# Patient Record
Sex: Female | Born: 1935 | Race: White | Hispanic: No | Smoking: Never smoker
Health system: Southern US, Community
[De-identification: ages and names within clinical notes are randomized; demographics above are authoritative.]

## PROBLEM LIST (undated history)

## (undated) DIAGNOSIS — R51 Headache: Secondary | ICD-10-CM

## (undated) DIAGNOSIS — C50911 Malignant neoplasm of unspecified site of right female breast: Secondary | ICD-10-CM

## (undated) DIAGNOSIS — M1990 Unspecified osteoarthritis, unspecified site: Secondary | ICD-10-CM

## (undated) DIAGNOSIS — E785 Hyperlipidemia, unspecified: Secondary | ICD-10-CM

## (undated) DIAGNOSIS — I10 Essential (primary) hypertension: Secondary | ICD-10-CM

## (undated) DIAGNOSIS — N210 Calculus in bladder: Secondary | ICD-10-CM

## (undated) DIAGNOSIS — Z9289 Personal history of other medical treatment: Secondary | ICD-10-CM

## (undated) HISTORY — PX: FOOT SURGERY: SHX648

## (undated) HISTORY — PX: VERTEBROPLASTY: SHX113

## (undated) HISTORY — PX: SPINE SURGERY: SHX786

## (undated) HISTORY — PX: KNEE ARTHROSCOPY: SUR90

## (undated) HISTORY — DX: Essential (primary) hypertension: I10

## (undated) HISTORY — DX: Malignant neoplasm of unspecified site of right female breast: C50.911

## (undated) HISTORY — PX: ABDOMINAL HYSTERECTOMY: SHX81

## (undated) HISTORY — PX: JOINT REPLACEMENT: SHX530

## (undated) HISTORY — PX: TONSILLECTOMY: SUR1361

## (undated) HISTORY — PX: OTHER SURGICAL HISTORY: SHX169

## (undated) HISTORY — PX: APPENDECTOMY: SHX54

## (undated) HISTORY — PX: WRIST FUSION: SHX839

## (undated) HISTORY — PX: DILATION AND CURETTAGE OF UTERUS: SHX78

---

## 1999-09-20 ENCOUNTER — Encounter: Admission: RE | Admit: 1999-09-20 | Discharge: 1999-09-20 | Payer: Self-pay | Admitting: Family Medicine

## 1999-09-20 ENCOUNTER — Encounter: Payer: Self-pay | Admitting: Family Medicine

## 2000-01-25 ENCOUNTER — Encounter: Payer: Self-pay | Admitting: Family Medicine

## 2000-01-25 ENCOUNTER — Encounter: Admission: RE | Admit: 2000-01-25 | Discharge: 2000-01-25 | Payer: Self-pay | Admitting: Family Medicine

## 2001-01-28 ENCOUNTER — Encounter: Payer: Self-pay | Admitting: Family Medicine

## 2001-01-28 ENCOUNTER — Encounter: Admission: RE | Admit: 2001-01-28 | Discharge: 2001-01-28 | Payer: Self-pay | Admitting: Family Medicine

## 2002-01-30 ENCOUNTER — Encounter: Payer: Self-pay | Admitting: Family Medicine

## 2002-01-30 ENCOUNTER — Encounter: Admission: RE | Admit: 2002-01-30 | Discharge: 2002-01-30 | Payer: Self-pay | Admitting: Family Medicine

## 2003-02-01 ENCOUNTER — Encounter: Payer: Self-pay | Admitting: Family Medicine

## 2003-02-01 ENCOUNTER — Encounter: Admission: RE | Admit: 2003-02-01 | Discharge: 2003-02-01 | Payer: Self-pay | Admitting: Family Medicine

## 2004-02-02 ENCOUNTER — Encounter: Admission: RE | Admit: 2004-02-02 | Discharge: 2004-02-02 | Payer: Self-pay | Admitting: Family Medicine

## 2004-10-25 ENCOUNTER — Ambulatory Visit: Payer: Self-pay | Admitting: Gastroenterology

## 2004-11-24 ENCOUNTER — Ambulatory Visit: Payer: Self-pay | Admitting: Gastroenterology

## 2004-12-12 ENCOUNTER — Ambulatory Visit: Payer: Self-pay | Admitting: Gastroenterology

## 2004-12-14 ENCOUNTER — Ambulatory Visit: Payer: Self-pay | Admitting: Gastroenterology

## 2004-12-21 ENCOUNTER — Ambulatory Visit: Payer: Self-pay | Admitting: Gastroenterology

## 2004-12-21 ENCOUNTER — Encounter: Payer: Self-pay | Admitting: Internal Medicine

## 2004-12-28 ENCOUNTER — Ambulatory Visit: Payer: Self-pay | Admitting: Gastroenterology

## 2005-01-29 ENCOUNTER — Ambulatory Visit: Payer: Self-pay | Admitting: Gastroenterology

## 2005-02-02 ENCOUNTER — Encounter: Admission: RE | Admit: 2005-02-02 | Discharge: 2005-02-02 | Payer: Self-pay | Admitting: Family Medicine

## 2005-02-15 ENCOUNTER — Encounter: Admission: RE | Admit: 2005-02-15 | Discharge: 2005-02-15 | Payer: Self-pay | Admitting: Family Medicine

## 2006-02-18 ENCOUNTER — Encounter: Admission: RE | Admit: 2006-02-18 | Discharge: 2006-02-18 | Payer: Self-pay | Admitting: Family Medicine

## 2006-06-04 ENCOUNTER — Inpatient Hospital Stay (HOSPITAL_COMMUNITY): Admission: RE | Admit: 2006-06-04 | Discharge: 2006-06-10 | Payer: Self-pay | Admitting: Orthopedic Surgery

## 2006-06-05 ENCOUNTER — Ambulatory Visit: Payer: Self-pay | Admitting: Physical Medicine & Rehabilitation

## 2007-02-20 ENCOUNTER — Encounter: Admission: RE | Admit: 2007-02-20 | Discharge: 2007-02-20 | Payer: Self-pay | Admitting: Family Medicine

## 2008-02-24 ENCOUNTER — Encounter: Admission: RE | Admit: 2008-02-24 | Discharge: 2008-02-24 | Payer: Self-pay | Admitting: Family Medicine

## 2008-04-20 ENCOUNTER — Encounter: Admission: RE | Admit: 2008-04-20 | Discharge: 2008-04-20 | Payer: Self-pay | Admitting: Orthopedic Surgery

## 2009-02-25 ENCOUNTER — Encounter: Admission: RE | Admit: 2009-02-25 | Discharge: 2009-02-25 | Payer: Self-pay | Admitting: Family Medicine

## 2009-12-26 ENCOUNTER — Encounter (INDEPENDENT_AMBULATORY_CARE_PROVIDER_SITE_OTHER): Payer: Self-pay | Admitting: *Deleted

## 2009-12-28 ENCOUNTER — Encounter (INDEPENDENT_AMBULATORY_CARE_PROVIDER_SITE_OTHER): Payer: Self-pay | Admitting: *Deleted

## 2010-02-01 ENCOUNTER — Encounter (INDEPENDENT_AMBULATORY_CARE_PROVIDER_SITE_OTHER): Payer: Self-pay | Admitting: *Deleted

## 2010-02-01 ENCOUNTER — Ambulatory Visit: Payer: Self-pay | Admitting: Internal Medicine

## 2010-02-01 DIAGNOSIS — Z860100 Personal history of colon polyps, unspecified: Secondary | ICD-10-CM | POA: Insufficient documentation

## 2010-02-01 DIAGNOSIS — K5730 Diverticulosis of large intestine without perforation or abscess without bleeding: Secondary | ICD-10-CM | POA: Insufficient documentation

## 2010-02-21 ENCOUNTER — Ambulatory Visit: Payer: Self-pay | Admitting: Internal Medicine

## 2010-02-26 ENCOUNTER — Encounter: Payer: Self-pay | Admitting: Internal Medicine

## 2010-02-27 ENCOUNTER — Encounter: Admission: RE | Admit: 2010-02-27 | Discharge: 2010-02-27 | Payer: Self-pay | Admitting: Family Medicine

## 2010-12-12 NOTE — Letter (Signed)
Summary: New Patient letter  [facility]  [address] [place], [place] [address]   Phone: [phone]  Fax: [phone]       12/28/2009 MRN: [id number]  [facility] [address] [place], [place]  [address]  Dear Ms. Bellanger,  Welcome to the Gastroenterology Division at [facility].    You are scheduled to see Dr.  Stan Head on February 01, 2010 at 2:45pm on the 3rd floor at [facility], [address]  We ask that you try to arrive at our office 15 minutes prior to your appointment time to allow for check-in.  We would like you to complete the enclosed self-administered evaluation form prior to your visit and bring it with you on the day of your appointment.  We will review it with you.  Also, please bring a complete list of all your medications or, if you prefer, bring the medication bottles and we will list them.  Please bring your insurance card so that we may make a copy of it.  If your insurance requires a referral to see a specialist, please bring your referral form from your primary care physician.  Co-payments are due at the time of your visit and may be paid by cash, check or credit card.     Your office visit will consist of a consult with your physician (includes a physical exam), any laboratory testing he/she may order, scheduling of any necessary diagnostic testing (e.g. x-ray, ultrasound, CT-scan), and scheduling of a procedure (e.g. Endoscopy, Colonoscopy) if required.  Please allow enough time on your schedule to allow for any/all of these possibilities.    If you cannot keep your appointment, please call [phone] to cancel or reschedule prior to your appointment date.  This allows [place] the opportunity to schedule an appointment for another patient in need of care.  If you do not cancel or reschedule by 5 p.m. the business day prior to your appointment date, you will be charged a $50.00 late cancellation/no-show fee.    Thank you  for choosing  Gastroenterology for your medical needs.  We appreciate the opportunity to care for you.  Please visit [place] at our website  to learn more about our practice.                     Sincerely,                                                             The Gastroenterology Division

## 2010-12-12 NOTE — Procedures (Signed)
Summary: Colonoscopy: Dr. Doreatha Martin: Diverticulosis   Colonoscopy  Procedure date:  12/21/2004  Findings:      Results: Polyp. Not retrieved Results: Diverticulosis.       Location:  [facility].    Procedures Next Due Date:    Colonoscopy: 12/2009  Patient Name: Jean [name], Jean [name] MRN:  Procedure Procedures: Colonoscopy CPT: [address].  Personnel: Endoscopist: Ulyess Mort, MD.  Referred By: Janalyn Rouse Steele Berg, MD.  Exam Location: Exam performed in Outpatient Clinic. Outpatient  Patient Consent: Procedure, Alternatives, Risks and Benefits discussed, consent obtained, from patient. Consent was obtained by the RN.  Indications  Evaluation of: Anemia with low iron saturation.  History  Current Medications: Patient is not currently taking Coumadin.  Pre-Exam Physical: Entire physical exam was normal.  Exam Exam: Extent of exam reached: Cecum, extent intended: Cecum.  The cecum was identified by appendiceal orifice and IC valve. Colon retroflexion performed. Images were not taken. ASA Classification: II. Tolerance: good.  Monitoring: Pulse and BP monitoring, Oximetry used. Supplemental O2 given.  Colon Prep Prep results: good.  Sedation Meds: Patient assessed and found to be appropriate for moderate (conscious) sedation. Fentanyl 75 mcg. given IV. Versed 5 mg. given IV.  Findings - DIVERTICULOSIS: Splenic Flexure to Sigmoid Colon. ICD9: Diverticulosis: 562.10. Comments: moderately severe ; area of fixed sigmoid colon.  POLYP: Descending Colon, Maximum size: 3 mm. sessile polyp. Procedure:  hot biopsy, removed, not retrieved,  POLYP: Ascending Colon, Maximum size: 4 mm. sessile polyp. Procedure:  hot biopsy, removed, not retrieved,  POLYP: Descending Colon, Maximum size: 2 mm. sessile polyp. Procedure:  hot biopsy, removed, not retrieved,  - OTHER FINDING: Cecum. Comments: only minnimally visualised because of colon tortuosity.  POLYP: Sigmoid Colon,  Maximum size: 4 mm. sessile polyp. Procedure:  hot biopsy, removed, not retrieved, ICD9: Colon Polyps: 211.3.  POLYP: Sigmoid Colon, Maximum size: 3 mm. sessile polyp. Procedure:  hot biopsy, removed, not retrieved,  POLYP: Sigmoid Colon, Maximum size: 2 mm. sessile polyp. Procedure:  hot biopsy, removed, not retrieved,  POLYP: Sigmoid Colon, Maximum size: 2 mm. sessile polyp. Procedure:  hot biopsy, removed, not retrieved,   Assessment Abnormal examination, see findings above.  Diagnoses: 562.10: Diverticulosis.  211.3: Colon Polyps.   Events  Unplanned Interventions: No intervention was required.  Unplanned Events: There were no complications. Plans Patient Education: Patient given standard instructions for: Polyps. Diverticulosis. Yearly hemoccult testing recommended. Patient instructed to get routine colonoscopy every 5 years. ???  do virtual colonoscopy next time ; pt. very hard to pass endoscope beyond the sigmoid colon  .  Disposition: After procedure patient sent to recovery. After recovery patient sent home.   CC:   Felix Pacini, MD  This report was created from the original endoscopy report, which was reviewed and signed by the above listed endoscopist.

## 2010-12-12 NOTE — Letter (Signed)
Summary: Colonoscopy-Changed to Office Visit Letter  [facility]  [address] [place], [place] [address]   Phone: [phone]  Fax: [phone]      December 26, 2009 MRN: [id number]   [facility] [address] [place], [place]  [address]   Dear Ms. Racanelli,   According to our records, it is time for you to schedule a Colonoscopy. However, after reviewing your medical record, I feel that an office visit would be most appropriate to more completely evaluate you and determine your need for a repeat procedure.  Please call [phone] (option #2) at your convenience to schedule an office visit. If you have any questions, concerns, or feel that this letter is in error, we would appreciate your call.   Sincerely,  Iva Boop, M.D.  [facility] Gastroenterology Division [phone]

## 2010-12-12 NOTE — Letter (Signed)
Summary: [facility] Instructions  [facility]  [address] [place], [place] [address]   Phone: [phone]  Fax: [phone]       JAYLIE NEAVES    1929-03-12    MRN: [id number]      Procedure Day Dorna Bloom: Jake Shark, [birth year]     Arrival Time: 1:00 PM      Procedure Time: 2:00 PM    Location of Procedure:                    _X_   Endoscopy Center (4th Floor)                     PREPARATION FOR COLONOSCOPY WITH MOVIPREP   Starting 5 days prior to your procedure 02/16/10 do not eat nuts, seeds, popcorn, corn, beans, peas,  salads, or any raw vegetables.  Do not take any fiber supplements (e.g. Metamucil, Citrucel, and Benefiber).  THE DAY BEFORE YOUR PROCEDURE         MONDAY, 02/20/10  1.  Drink clear liquids the entire day-NO SOLID FOOD  2.  Do not drink anything colored red or purple.  Avoid juices with pulp.  No orange juice.  3.  Drink at least 64 oz. (8 glasses) of fluid/clear liquids during the day to prevent dehydration and help the prep work efficiently.  CLEAR LIQUIDS INCLUDE: Water Jello Ice Popsicles Tea (sugar ok, no milk/cream) Powdered fruit flavored drinks Coffee (sugar ok, no milk/cream) Gatorade Juice: apple, white grape, white cranberry  Lemonade Clear bullion, consomm, broth Carbonated beverages (any kind) Strained chicken noodle soup Hard Candy                             4.  In the morning, mix first dose of MoviPrep solution:    Empty 1 Pouch A and 1 Pouch B into the disposable container    Add lukewarm drinking water to the top line of the container. Mix to dissolve    Refrigerate (mixed solution should be used within 24 hrs)  5.  Begin drinking the prep at 5:00 p.m. The MoviPrep container is divided by 4 marks.   Every 15 minutes drink the solution down to the next mark (approximately 8 oz) until the full liter is complete.   6.  Follow completed prep with 16 oz of clear liquid of your choice (Nothing red or purple).  Continue  to drink clear liquids until bedtime.  7.  Before going to bed, mix second dose of MoviPrep solution:    Empty 1 Pouch A and 1 Pouch B into the disposable container    Add lukewarm drinking water to the top line of the container. Mix to dissolve    Refrigerate  THE DAY OF YOUR PROCEDURE      TUESDAY, 02/21/10  Beginning at 9:00a.m. (5 hours before procedure):         1. Every 15 minutes, drink the solution down to the next mark (approx 8 oz) until the full liter is complete.  2. Follow completed prep with 16 oz. of clear liquid of your choice.    3. You may drink clear liquids until 12:00 PM (2 HOURS BEFORE PROCEDURE).  MEDICATION INSTRUCTIONS  Unless otherwise instructed, you should take regular prescription medications with a small sip of water   as early as possible the morning of your procedure.       OTHER INSTRUCTIONS  You will  need a responsible adult at least 75 years of age to accompany you and drive you home.   This person must remain in the waiting room during your procedure.  Wear loose fitting clothing that is easily removed.  Leave jewelry and other valuables at home.  However, you may wish to bring a book to read or  an iPod/MP3 player to listen to music as you wait for your procedure to start.  Remove all body piercing jewelry and leave at home.  Total time from sign-in until discharge is approximately 2-3 hours.  You should go home directly after your procedure and rest.  You can resume normal activities the  day after your procedure.  The day of your procedure you should not:   Drive   Make legal decisions   Operate machinery   Drink alcohol   Return to work  You will receive specific instructions about eating, activities and medications before you leave.    The above instructions have been reviewed and explained to me by   _______________________  I fully understand and can verbalize these instructions _____________________________ Date  _________

## 2010-12-12 NOTE — Assessment & Plan Note (Signed)
Summary: consult before proc. per md recall letter,...em   History of Present Illness Visit Type: Initial Visit Primary GI MD: Stan Head MD [facility] Primary Provider: Rosalyn Charters, MD Chief Complaint: history of colon polyps 2006 History of Present Illness:   77 yo white woman with prior colonoscopy 2006 [facility]). She had multiple diminutive polyps destroyed, no pathology. Also had severe diverticulosis and difficult colonoscopy due to somewhat fixed sigmoid colon. Recently passed a kidney stone CT abd/pelvis 01/09/10 reviewed. She is very active and moved a freezer, changed a car battery and inflated generator tires    GI Review of Systems      Denies abdominal pain, acid reflux, belching, bloating, chest pain, dysphagia with liquids, dysphagia with solids, heartburn, loss of appetite, nausea, vomiting, vomiting blood, weight loss, and  weight gain.      Reports hemorrhoids.     Denies anal fissure, black tarry stools, change in bowel habit, constipation, diarrhea, diverticulosis, fecal incontinence, heme positive stool, irritable bowel syndrome, jaundice, light color stool, liver problems, rectal bleeding, and  rectal pain. Preventive Screening-Counseling & Management  Alcohol-Tobacco     Smoking Status: never      Drug Use:  no.      Current Medications (verified): 1)  Exforge 5-320 Mg Tabs (Amlodipine Besylate-Valsartan) .... Once Daily 2)  Nadolol 20 Mg Tabs (Nadolol) .... Once Daily 3)  Diclofenac Sodium 75 Mg Tbec (Diclofenac Sodium) .... Take 1 Tablet Eevery 12 Hours 4)  Furosemide 20 Mg Tabs (Furosemide) .... Once Daily 5)  Pravastatin Sodium 20 Mg Tabs (Pravastatin Sodium) .... Once Daily 6)  Butalbital-Aspirin-Caffeine 50-325-40 Mg Tabs (Butalbital-Aspirin-Caffeine) .... As Needed For Migraines 7)  Cyanocobalamin 1000 Mcg/ml Soln (Cyanocobalamin) .Marland Kitchen.. 1000ug Im Injection Each Month  Allergies (verified): 1)  Pcn 2)  Keflex 3)  Percodan  Past History:  Past  Medical History: Osteoarthritis Degenerative Disk Disease Hx of Ureterolithiasis Asthma Chronic Headaches Hyperlipidemia Hypertension GERD Kidney Stones B12 deficiency  Past Surgical History: Appendectomy Hemorrhoidectomy Hysterectomy Spinal Fusion Knee Replacement-Left Total Foot surgery  Family History: No FH of Colon Cancer: Family History of Diabetes: Mother Family History of Heart Disease: Mother, Father Family History of Kidney Disease:Mother  Social History: Widowed, 2 adopted children Retired [employer] Patient has never smoked.  Alcohol Use - no Illicit Drug Use - no Smoking Status:  never Drug Use:  no  Review of Systems       The patient complains of allergy/sinus, arthritis/joint pain, headaches-new, hearing problems, heart murmur, and urine leakage.         All other ROS negative except as per HPI.   Vital Signs:  Patient profile:   77 year old female Height:      64 inches Weight:      153.13 pounds BMI:     26.38 Pulse rate:   72 / minute Pulse rhythm:   regular BP sitting:   150 / 66  (left arm) Cuff size:   regular  Vitals Entered By: June McMurray CMA [facility]) (February 01, 2010 2:58 PM)  Physical Exam  General:  Well developed, well nourished, no acute distress. Lungs:  Clear throughout to auscultation. Heart:  Regular rate and rhythm; no murmurs, rubs,  or bruits. Abdomen:  soft and non-tender without mass Rectal:  deferred until time of colonoscopy.   Skin:  some traumatic purpura Psych:  Alert and cooperative. Normal mood and affect.   Impression & Recommendations:  Problem # 1:  COLONIC POLYPS, HX OF (ICD-V12.72) Assessment New  Multiple diminutive polyps destroyed 2006. I explained that we do not know if any were adenomatous. Some could have been. So colonoscopy now, based upon abvailable info is reasonable. Her anatomy may make it more difficult and CT colonoscopy offrered but declined. She was also offered iFOBT  but prefers colonoscopy. Risks, benefits,and indications of endoscopic procedure(s) were reviewed with the patient and all questions answered.  Colonoscopy (Colon)  Problem # 2:  SPECIAL SCREENING FOR MALIGNANT NEOPLASMS COLON (ICD-V76.51) Assessment: New  Orders: Colonoscopy (Colon)  Problem # 3:  DIVERTICULOSIS OF COLON (ICD-562.10) Assessment: Comment Only Severe on CT and prior colonoscopy but asymptomatic. Orders: Colonoscopy (Colon)  Patient Instructions: 1)  Please pick up your medications at your pharmacy. MOVIPREP 2)  We will see you at your procedure on 02/21/10. 3)  [facility] Patient Information Guide given to patient.  4)  Colonoscopy and Flexible Sigmoidoscopy brochure given.  5)  The medication list was reviewed and reconciled.  All changed / newly prescribed medications were explained.  A complete medication list was provided to the patient / caregiver. Prescriptions: MOVIPREP 100 GM  SOLR (PEG-KCL-NACL-NASULF-NA ASC-C) As per prep instructions.  #1 x 0   Entered by:   Francee Piccolo CMA (AAMA)   Authorized by:   Iva Boop MD, [facility]   Signed by:   Francee Piccolo CMA (AAMA) on 02/01/2010   Method used:   Electronically to        [facility]* (retail)       [address]       [place], [place]  [address]       Ph: [phone]       Fax: [phone]   RxID:   [id number]

## 2010-12-12 NOTE — Procedures (Signed)
Summary: EGD: Dr. Doreatha Martin:    EGD  Procedure date:  12/21/2004  Findings:      Findings: Gastritis  RUT Neg Location: [facility]   Patient Name: Jean, [name] MRN:  Procedure Procedures: Panendoscopy (EGD) CPT: 43235.  Personnel: Endoscopist: Ulyess Mort, MD.  Referred By: Janalyn Rouse Steele Berg, MD.  Exam Location: Exam performed in Outpatient Clinic. Outpatient  Patient Consent: Procedure, Alternatives, Risks and Benefits discussed, consent obtained, from patient. Consent was obtained by the RN.  Indications  Evaluation of: Anemia,  with low ferritin. with low iron saturation.  History  Current Medications: Patient is not currently taking Coumadin.  Pre-Exam Physical: Entire physical exam was normal.  Exam Exam Info: Maximum depth of insertion Duodenum, intended Duodenum. Patient position: on left side. Vocal cords visualized. Gastric retroflexion performed. Images were not taken. ASA Classification: II. Tolerance: good.  Sedation Meds: Patient assessed and found to be appropriate for moderate (conscious) sedation. Fentanyl 50 mcg. given IV. Versed 7 mg. given IV. Cetacaine Spray 2 sprays given aerosolized.  Monitoring: BP and pulse monitoring done. Oximetry used. Supplemental O2 given  Findings - HIATAL HERNIA: 1 cms. in length. ICD9: Hernia, Hiatal: 553.3. - MUCOSAL ABNORMALITY: Cardia to Antrum. Granular mucosa. Edema present. RUT done, results pending. ICD9: Gastritis, Chronic: 535.10.  - Normal: Pyloric Sphincter to Jejunum.   Assessment Abnormal examination, see findings above.  Diagnoses: 553.3: Hernia, Hiatal.  535.10: Gastritis, Chronic.   Events  Unplanned Intervention: No unplanned interventions were required.  Unplanned Events: There were no complications. Plans Medication(s): Await pathology. Continue current medications. PPI: Esomeprazole/Nexium   Patient Education: Patient given standard instructions for: Hiatal  Hernia. Mucosal Abnormality.  Disposition: After procedure patient sent to recovery. After recovery patient sent home.   CC:   Felix Pacini, MD  This report was created from the original endoscopy report, which was reviewed and signed by the above listed endoscopist.

## 2010-12-12 NOTE — Procedures (Signed)
Summary: Colonoscopy  Patient: Jean [name] Note: All result statuses are Final unless otherwise noted.  Tests: (1) Colonoscopy (COL)   COL Colonoscopy           DONE     [facility]     [address]     [place], [place]  [address]           COLONOSCOPY PROCEDURE REPORT           PATIENT:  [name], [name]  MR#:  [id number]     BIRTHDATE:  [birth year], 80 yrs. old  GENDER:  female     ENDOSCOPIST:  Iva Boop, MD, [facility]           PROCEDURE DATE:  02/21/2010     PROCEDURE:  Colonoscopy with snare polypectomy     ASA CLASS:  Class II     INDICATIONS:  surveillance and high-risk screening, history of     polyps multiple polyps destroyed 2006     MEDICATIONS:   Fentanyl 75 mcg IV, Versed 8 mg IV           DESCRIPTION OF PROCEDURE:   After the risks benefits and     alternatives of the procedure were thoroughly explained, informed     consent was obtained.  Digital rectal exam was performed and     revealed no abnormalities.   The LB [id number]-[id number] endoscope     was introduced through the anus and advanced to the cecum, which     was identified by both the appendix and ileocecal valve, limited     by diverticulosis, severe, a tortuous colon.    The quality of the     prep was good, using MoviPrep.  The instrument was then slowly     withdrawn as the colon was fully examined.     Insertion: 12:55 minutes Withdrawal: 7:35 minutes     <<PROCEDUREIMAGES>>           FINDINGS:  A diminutive polyp was found in the ascending colon.     Polyp was snared without cautery. Retrieval was successful. snare     polyp  Severe diverticulosis was found throughout the colon.     Especially severe in sigmoid with angulation and luminal stenosis.     This was otherwise a normal examination of the colon.     Retroflexed views in the rectum revealed internal hemorrhoids.     The scope was then withdrawn from the patient and the procedure     completed.           COMPLICATIONS:  None  ENDOSCOPIC IMPRESSION:     1) Diminutive polyp in the ascending colon - removed     2) Severe diverticulosis throughout the colon, especially in the     sigmoid     3) Internal hemorrhoids     4) Otherwise normal examination, good prep     RECOMMENDATIONS:     1) Await pathology results           REPEAT EXAM:  at her age and with the difficulty of inserting     colonoscope not inclined to recommend routine repeat colonoscopy           Iva Boop, MD, [facility]           CC:  The Patient     Felix Pacini, MD           n.     eSIGNED:  Iva Boop at 02/21/2010 02:59 PM           Adria Devon, [id number]  Note: An exclamation mark (!) indicates a result that was not dispersed into the flowsheet. Document Creation Date: 02/21/2010 2:59 PM _______________________________________________________________________  (1) Order result status: Final Collection or observation date-time: 02/21/2010 14:45 Requested date-time:  Receipt date-time:  Reported date-time:  Referring Physician:   Ordering Physician: Stan Head [phone]) Specimen Source:  Source: Launa Grill Order Number: [id number] Lab site:

## 2010-12-12 NOTE — Letter (Signed)
Summary: Patient Notice-Hyperplastic Polyps  [facility]  [address] [place], [place] [address]   Phone: [phone]  Fax: [phone]        February 26, 2010 MRN: [id number]    [facility] [id number] [place], [place]  [address]    Dear Ms. Sobocinski,  I am pleased to inform you that the colon polyp removed during your recent colonoscopy was NOT pre-cancerous.  It is therefore my recommendation that you do not have a routine repeat colonoscopy examination given these findings and your overall history.  Should you develop new or worsening symptoms of abdominal pain, bowel habit changes or bleeding from the rectum or bowels, please schedule an evaluation with either your primary care physician or with me.  Please call us if you are having persistent problems or have questions about your condition that have not been fully answered at this time.   Sincerely,  Iva Boop MD, [facility] This letter has been electronically signed by your physician.  Appended Document: Patient Notice-Hyperplastic Polyps letter mailed 4.18.11  Appended Document: Patient Notice-Hyperplastic Polyps February 26, 2010 MRN: [id number]       [facility] [address] [place], [place]  [address]     Dear Ms. Lindor,  I am pleased to inform you that the colon polyp removed during your recent colonoscopy was NOT pre-cancerous.  It is therefore my recommendation that you do not have a routine repeat colonoscopy examination given these findings and your overall history.  Should you develop new or worsening symptoms of abdominal pain, bowel habit changes or bleeding from the rectum or bowels, please schedule an evaluation with either your primary care physician or with me.  Please call us if you are having persistent problems or have questions about your condition that have not been fully answered at this time.   Sincerely,  Iva Boop MD, [facility] This letter has been  electronically signed by your physician.

## 2010-12-28 ENCOUNTER — Ambulatory Visit: Payer: Self-pay | Admitting: Physical Therapy

## 2011-03-22 ENCOUNTER — Other Ambulatory Visit: Payer: Self-pay | Admitting: Family Medicine

## 2011-03-22 DIAGNOSIS — Z1231 Encounter for screening mammogram for malignant neoplasm of breast: Secondary | ICD-10-CM

## 2011-03-30 NOTE — Discharge Summary (Signed)
NAMEJOLIET, MALLOZZI                 ACCOUNT NO.:  [id number]   MEDICAL RECORD NO.:  [id number]          PATIENT TYPE:  INP   LOCATION:  5005                         FACILITY:  [facility]   PHYSICIAN:  Burnard Bunting, M.D.    DATE OF BIRTH:  January 08, 1929   DATE OF ADMISSION:  06/04/2006  DATE OF DISCHARGE:  06/10/2006                                 DISCHARGE SUMMARY   DISCHARGE DIAGNOSES:  Left knee arthritis.   SECONDARY DIAGNOSES:  1. History of spinal fusion.  2. Hypertension.  3. Gastroesophageal reflux disease.  4. Hypercholesterolemia.   OPERATIONS/PROCEDURES:  Left total knee replacement,  06/04/2006.   HOSPITAL COURSE:  This is a 77 year old patient who presents today with left  knee arthritis.  She underwent left total knee replacement 06/04/2006.  She  tolerated the procedure well without immediate complications.  She had  intact perfusion and sensation to the foot.  On postop day #1 she was  started on Coumadin for DVT prophylaxis, physical therapy for mobilization  and TPM for range of motion.  She mobilized well in the hospital.  The  incision was intact on postop day #3.  She was therapeutic on her Coumadin  by the time of discharge.  Hemoglobin was 10 on postop day #2.  She had an  otherwise unremarkable recovery and was ambulating in the hall by the time  of discharge.  She was discharged home on 06/10/2006 in good condition.   DISCHARGE MEDICATIONS:  Include:  1. Cozaar.  2. Nadolol.  3. Omeprazole.  4. Lipitor.  5. Triamterene  6. Hydrochlorothiazide.  7. Percocet for pain.  8. Robaxin muscle relaxant.  9. Coumadin for DVT prophylaxis.   FOLLOW UP:  She is to follow up with me in 1 week for suture removal,  continue weight bearing as tolerated.  Home LPT and CPM machine.           ______________________________  G. Dorene Grebe, M.D.     GSD/MEDQ  D:  07/03/2006  T:  07/04/2006  Job:  [id number]

## 2011-03-30 NOTE — Op Note (Signed)
NAMEONA, ROEHRS NO.:  [id number]   MEDICAL RECORD NO.:  [id number]          PATIENT TYPE:  INP   LOCATION:  5005                         FACILITY:  [facility]   PHYSICIAN:  Burnard Bunting, M.D.    DATE OF BIRTH:  January 06, 1929   DATE OF PROCEDURE:  06/04/2006  DATE OF DISCHARGE:                                 OPERATIVE REPORT   PREOPERATIVE DIAGNOSIS:  Left knee arthritis.   POSTOPERATIVE DIAGNOSIS:  Same.   PROCEDURE:  Left total knee replacement.   SURGEON:  Burnard Bunting, M.D.   ASSISTANT:  Jerolyn Shin. Lavender, M.D.   ESTIMATED BLOOD LOSS:  150 mL.   DRAINS:  Hemovac times one.   TOURNIQUET TIME:  Two hours at 300 mmHg followed by 15 minutes of down time  followed by 12 minutes up at 300 mmHg for cementation.   COMPONENTS:  DePuy's posterior stabilizer, #3 femur, #2.5 tibia, #10 Pollack  and #32 patella.   INDICATION:  Jean [name] is a 77 year old female with end stage left knee  arthritis who has failed conservative management.  She presents now for a  knee replacement.  All risks and benefits discussed with the patient. She  wished to procedure.   PROCEDURE IN DETAIL:  The patient was brought to the operating where general  endotracheal anesthesia was induced.  Preoperative antibiotic was  administered.  The left leg was prepped with DuraPrep solution and draped in  a sterile manner.  Vi Drape was used to cover the operative field.  The leg  was elevated and exsanguinated with Esmarch tourniquet which was inflated.  Anterior posterior knee was entered and the subcutaneous tissue was sharply  divided.  A median peripatellar arthrotomy was made with precise location of  this marked with the suture.  The acetabulum was partially excised.  The ACL  and PCL were released.  Lateral patellofemoral ligament was released.  Soft  tissues from the anterior distal aspect of the femur were resected with  minimal violation of the suprapatellar pouch.  At this time  two pins were  placed in the distal medial femur and proximal medial tibia.  Registration  points were obtained beginning with the center of the hip rotation as well  as the medial lateral malleoli and then various points about the knee.  Tibial cut was made in the course of preoperative tem plating and to  generate a model.  Collateral and posterior ligaments near basilar  structures were protected.  Tension was then placed in the full extension  and 90 degrees of flexion.  Distal femoral cut was then made.  Chain four  and five cuts were made.  Trials were placed.  Soft tissue was released from  the fissure aspect and the menisci were resected.  The incision was then  marked with chalk components and then the patient achieved full extension at  2 1/2 to 3 degrees of valgus with excellent patella tracking.  At this time  the patella was cut free hand with 10 mm resection off the 20 mm patella.  32 mm button  was placed and the trial parameters were maintained.  The  patient had excellent stability of varus and valgus stress at 0, 30 and 90  degrees of flexion.  The patient's knee was thoroughly irrigated.  Chalk  components were removed.  Tourniquet was released.  Continuous irrigation  was performed.  Tourniquet was then re-inflated 15 minutes after its release  and the components were cemented into position.  Excess cement was removed.  The trunk was released.  Bleeding points encountered were controlled with  electrocautery.  The incision was then closed over a drain using a #1 Vicryl  suture which was followed with interrupted  2-0 Vicryl suture and skin  stapes.  The incisions for the pins were then also closed using 3-0 Nylon  suture.  The patient was placed in a bulky dressing knee immobilizer.  She  was injected with a solution of Marcaine and clonidine.  The assistance of  Dr. Tresa Res was required for retraction of neurovascular structures.  Good  visualization of the drain and  cementation.           ______________________________  G. Dorene Grebe, M.D.     GSD/MEDQ  D:  06/04/2006  T:  06/05/2006  Job:  [id number]

## 2011-03-30 NOTE — Op Note (Signed)
NAMETAJANA, CROTTEAU NO.:  [id number]   MEDICAL RECORD NO.:  [id number]          PATIENT TYPE:  INP   LOCATION:  5005                         FACILITY:  [facility]   PHYSICIAN:  Burnard Bunting, M.D.    DATE OF BIRTH:  [birth year]   DATE OF PROCEDURE:  06/04/2006  DATE OF DISCHARGE:  06/10/2006                                 OPERATIVE REPORT   SURGEON:  Burnard Bunting, M.D.   ASSISTANT:  Jerolyn Shin. Tresa Res, M.D.   PREOPERATIVE DIAGNOSIS:  Left knee arthritis.   POSTOPERATIVE DIAGNOSIS:  Left knee arthritis.   PROCEDURE:  Left total knee replacement using cemented posterior cruciate  ligament-sacrificing DePuy components, rotating platform, 3 femur, 2.5  tibia, 10 polyethylene, 32 patella.   ANESTHESIA:  General.   ESTIMATED BLOOD LOSS:  150.   DRAINS:  Hemovac.   Dictation ended at this point.           ______________________________  G. Dorene Grebe, M.D.     GSD/MEDQ  D:  07/03/2006  T:  07/03/2006  Job:  [id number]

## 2011-04-02 ENCOUNTER — Ambulatory Visit
Admission: RE | Admit: 2011-04-02 | Discharge: 2011-04-02 | Disposition: A | Discharge status: Home / Self Care | Payer: Medicare Other | Source: Ambulatory Visit | Attending: Family Medicine | Admitting: Family Medicine

## 2011-04-02 DIAGNOSIS — Z1231 Encounter for screening mammogram for malignant neoplasm of breast: Secondary | ICD-10-CM

## 2012-03-07 ENCOUNTER — Other Ambulatory Visit: Payer: Self-pay | Admitting: Family Medicine

## 2012-03-07 DIAGNOSIS — Z1231 Encounter for screening mammogram for malignant neoplasm of breast: Secondary | ICD-10-CM

## 2012-04-04 ENCOUNTER — Ambulatory Visit
Admission: RE | Admit: 2012-04-04 | Discharge: 2012-04-04 | Disposition: A | Discharge status: Home / Self Care | Payer: Medicare Other | Source: Ambulatory Visit | Attending: Family Medicine | Admitting: Family Medicine

## 2012-04-04 DIAGNOSIS — Z1231 Encounter for screening mammogram for malignant neoplasm of breast: Secondary | ICD-10-CM

## 2012-04-14 ENCOUNTER — Other Ambulatory Visit: Payer: Self-pay | Admitting: Family Medicine

## 2012-04-14 DIAGNOSIS — R928 Other abnormal and inconclusive findings on diagnostic imaging of breast: Secondary | ICD-10-CM

## 2012-04-22 ENCOUNTER — Ambulatory Visit
Admission: RE | Admit: 2012-04-22 | Discharge: 2012-04-22 | Disposition: A | Discharge status: Home / Self Care | Payer: Medicare Other | Source: Ambulatory Visit | Attending: Family Medicine | Admitting: Family Medicine

## 2012-04-22 ENCOUNTER — Other Ambulatory Visit: Payer: Self-pay | Admitting: Family Medicine

## 2012-04-22 DIAGNOSIS — R928 Other abnormal and inconclusive findings on diagnostic imaging of breast: Secondary | ICD-10-CM

## 2012-04-23 ENCOUNTER — Other Ambulatory Visit: Payer: Self-pay | Admitting: Family Medicine

## 2012-04-23 DIAGNOSIS — C50911 Malignant neoplasm of unspecified site of right female breast: Secondary | ICD-10-CM

## 2012-04-24 ENCOUNTER — Telehealth: Payer: Self-pay | Admitting: *Deleted

## 2012-04-24 ENCOUNTER — Other Ambulatory Visit: Payer: Self-pay | Admitting: *Deleted

## 2012-04-24 DIAGNOSIS — C50419 Malignant neoplasm of upper-outer quadrant of unspecified female breast: Secondary | ICD-10-CM

## 2012-04-24 NOTE — Telephone Encounter (Signed)
Confirmed [facility] for 04/30/12 at 0800.  Instructions and contact information given.  

## 2012-04-25 ENCOUNTER — Ambulatory Visit
Admission: RE | Admit: 2012-04-25 | Discharge: 2012-04-25 | Disposition: A | Discharge status: Home / Self Care | Payer: Medicare Other | Source: Ambulatory Visit | Attending: Family Medicine | Admitting: Family Medicine

## 2012-04-25 DIAGNOSIS — C50911 Malignant neoplasm of unspecified site of right female breast: Secondary | ICD-10-CM

## 2012-04-25 MED ORDER — GADOBENATE DIMEGLUMINE 529 MG/ML IV SOLN
15.0000 mL | Freq: Once | INTRAVENOUS | Status: AC | PRN
  Administered 2012-04-25: 15 mL via INTRAVENOUS

## 2012-04-28 ENCOUNTER — Other Ambulatory Visit: Payer: Medicare Other

## 2012-04-29 ENCOUNTER — Other Ambulatory Visit: Payer: Self-pay | Admitting: Family Medicine

## 2012-04-29 DIAGNOSIS — R928 Other abnormal and inconclusive findings on diagnostic imaging of breast: Secondary | ICD-10-CM

## 2012-04-30 ENCOUNTER — Encounter: Payer: Self-pay | Admitting: *Deleted

## 2012-04-30 ENCOUNTER — Other Ambulatory Visit (HOSPITAL_BASED_OUTPATIENT_CLINIC_OR_DEPARTMENT_OTHER): Payer: Medicare Other | Admitting: Lab

## 2012-04-30 ENCOUNTER — Ambulatory Visit
Admission: RE | Admit: 2012-04-30 | Discharge: 2012-04-30 | Disposition: A | Discharge status: Home / Self Care | Payer: Medicare Other | Source: Ambulatory Visit | Attending: Radiation Oncology | Admitting: Radiation Oncology

## 2012-04-30 ENCOUNTER — Ambulatory Visit: Payer: Medicare Other

## 2012-04-30 ENCOUNTER — Ambulatory Visit: Payer: Medicare Other | Admitting: Physical Therapy

## 2012-04-30 ENCOUNTER — Ambulatory Visit (HOSPITAL_BASED_OUTPATIENT_CLINIC_OR_DEPARTMENT_OTHER): Payer: Medicare Other | Admitting: Surgery

## 2012-04-30 ENCOUNTER — Encounter: Payer: Self-pay | Admitting: Oncology

## 2012-04-30 ENCOUNTER — Ambulatory Visit (HOSPITAL_BASED_OUTPATIENT_CLINIC_OR_DEPARTMENT_OTHER): Payer: Medicare Other | Admitting: Oncology

## 2012-04-30 ENCOUNTER — Telehealth: Payer: Self-pay | Admitting: *Deleted

## 2012-04-30 ENCOUNTER — Encounter: Payer: Self-pay | Admitting: Radiation Oncology

## 2012-04-30 ENCOUNTER — Encounter (INDEPENDENT_AMBULATORY_CARE_PROVIDER_SITE_OTHER): Payer: Self-pay | Admitting: Surgery

## 2012-04-30 VITALS — BP 120/70 | HR 72 | Temp 98.0°F | Resp 16

## 2012-04-30 VITALS — BP 210/85 | HR 60 | Temp 98.2°F | Ht 61.0 in | Wt 149.4 lb

## 2012-04-30 DIAGNOSIS — M129 Arthropathy, unspecified: Secondary | ICD-10-CM

## 2012-04-30 DIAGNOSIS — C50419 Malignant neoplasm of upper-outer quadrant of unspecified female breast: Secondary | ICD-10-CM

## 2012-04-30 DIAGNOSIS — I10 Essential (primary) hypertension: Secondary | ICD-10-CM

## 2012-04-30 DIAGNOSIS — C50919 Malignant neoplasm of unspecified site of unspecified female breast: Secondary | ICD-10-CM

## 2012-04-30 DIAGNOSIS — C50911 Malignant neoplasm of unspecified site of right female breast: Secondary | ICD-10-CM

## 2012-04-30 DIAGNOSIS — Z170 Estrogen receptor positive status [ER+]: Secondary | ICD-10-CM

## 2012-04-30 DIAGNOSIS — C50511 Malignant neoplasm of lower-outer quadrant of right female breast: Secondary | ICD-10-CM | POA: Insufficient documentation

## 2012-04-30 HISTORY — DX: Malignant neoplasm of unspecified site of right female breast: C50.911

## 2012-04-30 LAB — COMPREHENSIVE METABOLIC PANEL
ALT: 14 U/L (ref 0–35)
AST: 22 U/L (ref 0–37)
Alkaline Phosphatase: 71 U/L (ref 39–117)
CO2: 25 mEq/L (ref 19–32)
Sodium: 136 mEq/L (ref 135–145)
Total Bilirubin: 0.3 mg/dL (ref 0.3–1.2)
Total Protein: 6.9 g/dL (ref 6.0–8.3)

## 2012-04-30 LAB — CBC WITH DIFFERENTIAL/PLATELET
BASO%: 0.7 % (ref 0.0–2.0)
EOS%: 0.9 % (ref 0.0–7.0)
LYMPH%: 10 % — ABNORMAL LOW (ref 14.0–49.7)
MCH: 29.9 pg (ref 25.1–34.0)
MCHC: 33.4 g/dL (ref 31.5–36.0)
MONO#: 0.8 10*3/uL (ref 0.1–0.9)
Platelets: 273 10*3/uL (ref 145–400)
RBC: 4.12 10*6/uL (ref 3.70–5.45)
WBC: 13.6 10*3/uL — ABNORMAL HIGH (ref 3.9–10.3)

## 2012-04-30 LAB — CANCER ANTIGEN 27.29: CA 27.29: 15 U/mL (ref 0–39)

## 2012-04-30 NOTE — Patient Instructions (Addendum)
I will see you back after your surgery

## 2012-04-30 NOTE — Progress Notes (Signed)
Patient ID: Jean Jean [name], female   DOB: [birth year], 78 y.o.   MRN: [id number]  Chief Complaint  Patient presents with  . Other    right breast cancer    HPI Jean Jean [name] is Jean [name] 78 y.o. female.  Pleasant female referred by Dr. Felix Pacini in for evaluation of Jean [name] recent diagnosis of right breast cancer found on screening mammography. She has had no previous problems with her breast. She denies nipple discharge. She has no other complaints. HPI  Past Medical History  Diagnosis Date  . Hypertension     Past Surgical History  Procedure Date  . Abdominal hysterectomy   . Appendectomy   . Joint replacement   . Spine surgery   . Hernia repair     History reviewed. No pertinent family history.  Social History History  Substance Use Topics  . Smoking status: Not on file  . Smokeless tobacco: Not on file  . Alcohol Use:     Allergies  Allergen Reactions  . Cephalexin   . Oxycodone-Aspirin   . Penicillins     Current Outpatient Prescriptions  Medication Sig Dispense Refill  . amLODipine-valsartan (EXFORGE) 10-320 MG per tablet Take 1 tablet by mouth daily.      . ferrous sulfate 325 (65 FE) MG tablet Take 325 mg by mouth daily with breakfast.      . hydrALAZINE (APRESOLINE) 25 MG tablet Take 25 mg by mouth 3 (three) times daily.      . meloxicam (MOBIC) 15 MG tablet Take 15 mg by mouth daily.      . nadolol (CORGARD) 40 MG tablet Take 40 mg by mouth daily.      . potassium chloride (K-DUR) 10 MEQ tablet Take 20 mEq by mouth daily.      . pravastatin (PRAVACHOL) 40 MG tablet Take 40 mg by mouth daily.        Review of Systems Review of Systems  Constitutional: Negative for fever, chills and unexpected weight change.  HENT: Negative for hearing loss, congestion, sore throat, trouble swallowing and voice change.   Eyes: Negative for visual disturbance.  Respiratory: Negative for cough and wheezing.   Cardiovascular: Negative for chest pain, palpitations and leg swelling.   Gastrointestinal: Negative for nausea, vomiting, abdominal pain, diarrhea, constipation, blood in stool, abdominal distention and anal bleeding.  Genitourinary: Negative for hematuria, vaginal bleeding and difficulty urinating.  Musculoskeletal: Negative for arthralgias.  Skin: Negative for rash and wound.  Neurological: Negative for seizures, syncope and headaches.  Hematological: Negative for adenopathy. Does not bruise/bleed easily.  Psychiatric/Behavioral: Negative for confusion.    Blood pressure 120/70, pulse 72, temperature 98 F (36.7 C), resp. rate 16.  Physical Exam Physical Exam  Constitutional: She is oriented to person, place, and time. She appears well-developed and well-nourished. No distress.  HENT:  Head: Normocephalic and atraumatic.  Right Ear: External ear normal.  Left Ear: External ear normal.  Nose: Nose normal.  Mouth/Throat: Oropharynx is clear and moist. No oropharyngeal exudate.  Eyes: Conjunctivae are normal. Pupils are equal, round, and reactive to light. Right eye exhibits no discharge. Left eye exhibits no discharge. No scleral icterus.  Neck: Normal range of motion. Neck supple. No tracheal deviation present. No thyromegaly present.  Cardiovascular: Normal rate, regular rhythm, normal heart sounds and intact distal pulses.   No murmur heard. Pulmonary/Chest: Effort normal and breath sounds normal. No respiratory distress. She has no wheezes. She has no rales.  Abdominal: Soft. Bowel sounds are normal.  She exhibits no distension. There is no tenderness. There is no rebound.  Musculoskeletal: Normal range of motion. She exhibits no edema and no tenderness.  Lymphadenopathy:    She has no cervical adenopathy.  Neurological: She is alert and oriented to person, place, and time.  Skin: Skin is warm and dry. No rash noted. She is not diaphoretic. No erythema.  Psychiatric: Her behavior is normal. Thought content normal.    Data Reviewed I have reviewed  her mammograms, MRI, and biopsy results showing ER/PR positive lobular cancer of the right breast. There are 2 other suspicious areas in the right breast on MRI  Assessment    Invasive lobular right breast cancer 2 other suspicious areas.    Plan    The patient already came here to the breast clinic with Jean [name] decision that she wanted to go ahead and proceed with Jean [name] right simple mastectomy rather than biopsied these other areas. We have discussed this and feel this is reasonable. I discussed double mastectomy with her in detail. I discussed the risks of surgery which includes but not limited to bleeding, infection, seroma formation, injury to surround structures, need for further surgery, et Karie Soda. We are not planning sentinel lymph node biopsy at this time. I also discussed reconstruction with her and she has declined. Right simple mastectomy will thus be scheduled. Likelihood of success is good.       Jean Jean [name] 04/30/2012, 9:51 AM

## 2012-04-30 NOTE — Progress Notes (Signed)
Radiation Oncology         [phone] ________________________________  Initial outpatient Consultation  Name: Jean [name] MRN: [id number]  Date: 04/30/2012  DOB: [birth year]  MV:HQIONG,EXBM, MD  Shelly Rubenstein, MD   REFERRING PHYSICIAN: Shelly Rubenstein, MD  DIAGNOSIS: The encounter diagnosis was Breast cancer, right.  HISTORY OF PRESENT ILLNESS::Jean [name] is a 78 y.o. female who is seen as part of the multidisciplinary breast clinic. A recent screening mammogram showed a possible mass within the right breast. Patient proceeded to undergo additional imaging and biopsy as documented below. Biopsy revealed invasive lobular tumor which was estrogen positive at 100% and progesterone receptor positive at 100%. There was no HER-2/neu amplification. Patient proceeded to undergo the MRI of the breast/chest area which revealed the dominant mass in the upper-outer quadrant of the right breast which measured 2.0 x 1.8 x 2.0 cm. In addition within the lateral aspect there was noted to have additional 1 cm area of linear enhancement as well as a 5 mm enhancing nodule in the central portion of the right breast. Patient is now seen for a further evaluation. Her x-rays and pathology were also reviewed earlier today at the multidisciplinary  breast conference.   PREVIOUS RADIATION THERAPY: No  PAST MEDICAL HISTORY:  has a past medical history of Hypertension and Breast cancer, right (04/30/2012).    PAST SURGICAL HISTORY: Past Surgical History  Procedure Date  . Abdominal hysterectomy   . Appendectomy   . Joint replacement   . Spine surgery   . Hernia repair     FAMILY HISTORY: No family history of breast or ovarian cancer.  SOCIAL HISTORY:  reports that she has never smoked. She does not have any smokeless tobacco history on file. no history of ethanol use. 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 amLODipine-valsartan (EXFORGE) 10-320 MG per tablet Take 1 tablet by mouth daily.      . ferrous sulfate 325 (65 FE) MG tablet Take 325 mg by mouth daily with breakfast.      . hydrALAZINE (APRESOLINE) 25 MG tablet Take 25 mg by mouth 3 (three) times daily.      . meloxicam (MOBIC) 15 MG tablet Take 15 mg by mouth daily.      . nadolol (CORGARD) 40 MG tablet Take 40 mg by mouth daily.      . potassium chloride (K-DUR) 10 MEQ tablet Take 20 mEq by mouth daily.      . pravastatin (PRAVACHOL) 40 MG tablet Take 40 mg by mouth daily.        REVIEW OF SYSTEMS:  A 15 point review of systems is documented in the electronic medical record. This was obtained by the nursing staff. However, I reviewed this with the patient to discuss relevant findings and make appropriate changes. Prior to biopsy the patient denied any pain in the breast area nipple discharge or bleeding. She has never had a prior biopsy of the breast.   PHYSICAL EXAM: In general this is a very pleasant well-kept healthy-appearing 78 year old female no acute distress. She is accompanied by her daughter on evaluation today. Vital signs temperature 98.2 pulse  she's  pulse 72 respirations 20 blood pressure is 120/70. No palpable cervical supraclavicular or axillary adenopathy. The lungs are clear to auscultation. The heart has regular rhythm and rate. The abdomen is soft and nontender with normal bowel sounds. Examination  left breast reveals no mass or nipple discharge. Examination right breast reveals some bruising in the upper outer aspect of the breast area. There is no dominant mass appreciated breast nipple discharge or bleeding.   LABORATORY DATA:  Lab Results  Component Value Date   WBC 13.6* 04/30/2012   HGB 12.3 04/30/2012   HCT 36.9 04/30/2012   MCV 89.5 04/30/2012   PLT 273 04/30/2012   Lab Results  Component Value Date   NA 136 04/30/2012   K 3.6 04/30/2012   CL 101 04/30/2012    CO2 25 04/30/2012   Lab Results  Component Value Date   ALT 14 04/30/2012   AST 22 04/30/2012   ALKPHOS 71 04/30/2012   BILITOT 0.3 04/30/2012     RADIOGRAPHY: US Breast Right  04/22/2012  *RADIOLOGY REPORT*  Clinical Data:  The patient returns for evaluation of a possible mass in the right upper outer quadrant posteriorly noted on recent screening study dated 04/04/2012.  DIGITAL DIAGNOSTIC RIGHT LIMITED MAMMOGRAM  AND RIGHT BREAST ULTRASOUND:  Comparison:  04/02/2011, 02/27/2010, 02/25/2009  Findings:  Additional views demonstrate an asymmetric density with central fat in the right upper outer quadrant posteriorly.  On physical exam, no mass is palpated in the right upper outer quadrant.  Ultrasound is performed, showing an ill-defined hypoechoic mass at 10 o'clock 7 cm in the right nipple measuring 1.1 x 0.6 x 0.9 cm. Sonography of the right axilla demonstrates no abnormal nodes. This may represent stromal fibrosis but invasive mammary carcinoma is not excluded.  Ultrasound-guided core needle biopsy is recommended.  IMPRESSION: Ill-defined hypoechoic mass at 10 o'clock 7 cm from the right nipple.  Ultrasound-guided core needle biopsy is suggested.  This will be performed and reported separately.  BI-RADS CATEGORY 4:  Suspicious abnormality - biopsy should be considered.  Original Report Authenticated By: Daryl Eastern, M.D.   Mr Breast Bilateral W Wo Contrast  04/28/2012  *RADIOLOGY REPORT*  Clinical Data: Recently diagnosed right breast invasive lobular carcinoma  BUN and creatinine were obtained on site at [facility] at [address] Results:  BUN 12 mg/dL,  Creatinine 1.1 mg/dL.  BILATERAL BREAST MRI WITH AND WITHOUT CONTRAST  Technique: Multiplanar, multisequence MR images of both breasts were obtained prior to and following the intravenous administration of 15ml of multihance.  Three dimensional images were evaluated at the independent DynaCad workstation.  Comparison:   Mammograms dated 04/22/2012 and 04/04/2012  Findings: There is a moderate background parenchymal enhancement pattern.  Right breast: 1.  In the posterior one third of the upper outer quadrant of the right breast there is an irregular, enhancing mass measuring 2.0 x 1.8 x 2.0 cm.  It is associated with a clip artifact. 2.  In the lateral aspect of the right breast there is a 1 cm area of linear enhancement. 3.  There is a 5 mm enhancing nodule in the central portion of the right breast. 4.  There is a 4 mm enhancing nodule in the upper inner quadrant of the right breast.  No abnormal enhancement is seen in the left  breast.  There is no enlarged axillary or internal mammary adenopathy.  IMPRESSION:  1.  2 cm enhancing mass in the upper outer quadrant of the right breast corresponding well with the known malignancy. 2. In addition two enhancing nodules and one area of linear enhancement are seen in the right breast.  I would recommend MR guided core biopsies of the central nodule and the linear  area of enhancement. 3.  No abnormal enhancement in the left breast.  RECOMMENDATION: BI-RADS CATEGORY 4:  Suspicious abnormality - biopsy should be considered.  THREE-DIMENSIONAL MR IMAGE RENDERING ON INDEPENDENT WORKSTATION:  Three-dimensional MR images were rendered by post-processing of the original MR data on an independent workstation.  The three- dimensional MR images were interpreted, and findings were reported in the accompanying complete MRI report for this study.  Original Report Authenticated By: Littie Deeds. Judyann Munson, M.D.   [place] Core Biopsy  04/23/2012  *RADIOLOGY REPORT*  Clinical Data:  Ill-defined hypoechoic mass at 10 o'clock 7 cm from the right nipple.  ULTRASOUND GUIDED VACUUM ASSISTED CORE BIOPSY OF THE RIGHT BREAST  The patient and I discussed the procedure of ultrasound-guided biopsy, including benefits and alternatives.  We discussed the high likelihood of a successful procedure. We discussed the risks of the  procedure including infection, bleeding, tissue injury, clip migration, and inadequate sampling.  Informed written consent was given.  Using sterile technique, 2% lidocaine ultrasound guidance and a 12 gauge vacuum assisted needle, biopsy was performed of the mass at 10 o'clock 7 cm from the right nipple.  At the conclusion of the procedure, a coil tissue marker clip was deployed into the biopsy cavity.  Follow-up 2-view mammogram was performed and dictated separately.  Histologic evaluation demonstrates grade I - II invasive mammary carcinoma.  E-cadherin stains to determine if this is ductal or lobular carcinoma are pending. This is concordant with the imaging findings.  Results were discussed with the patient by telephone at her request.  She reports no complications from the procedure.  The patient requests consultation with the [facility].  This has been scheduled for 04/30/2012. Preoperative breast MRI will be attempted on 04/28/2012.  The patient does have some claustrophobia so this may not be possible  IMPRESSION: Ultrasound-guided biopsy of a mass at 10 o'clock 7 cm from the right nipple.  Grade I-II invasive mammary carcinoma is diagnosed. Breast MRI will be attempted on 04/28/2012.  The patient has been scheduled to be seen in the [facility] on 04/30/2012.  No apparent complications.  Original Report Authenticated By: Daryl Eastern, M.D.   Mm Digital Diag Ltd R  04/22/2012  *RADIOLOGY REPORT*  Clinical Data:  The patient returns for evaluation of a possible mass in the right upper outer quadrant posteriorly noted on recent screening study dated 04/04/2012.  DIGITAL DIAGNOSTIC RIGHT LIMITED MAMMOGRAM  AND RIGHT BREAST ULTRASOUND:  Comparison:  04/02/2011, 02/27/2010, 02/25/2009  Findings:  Additional views demonstrate an asymmetric density with central fat in the right upper outer quadrant posteriorly.  On physical exam, no mass  is palpated in the right upper outer quadrant.  Ultrasound is performed, showing an ill-defined hypoechoic mass at 10 o'clock 7 cm in the right nipple measuring 1.1 x 0.6 x 0.9 cm. Sonography of the right axilla demonstrates no abnormal nodes. This may represent stromal fibrosis but invasive mammary carcinoma is not excluded.  Ultrasound-guided core needle biopsy is recommended.  IMPRESSION: Ill-defined hypoechoic mass at 10 o'clock 7 cm from the right nipple.  Ultrasound-guided core needle biopsy is suggested.  This will be performed and reported separately.  BI-RADS CATEGORY 4:  Suspicious abnormality - biopsy should be considered.  Original Report Authenticated By: Daryl Eastern, M.D.   Mm Digital Diagnostic Unilat R  04/22/2012  *RADIOLOGY REPORT*  Clinical Data:  Ultrasound-guided core needle biopsy of a hypoechoic area at 10 o'clock 7 cm from  the right nipple with clip placement.  DIGITAL DIAGNOSTIC RIGHT MAMMOGRAM  Comparison:  None.  Findings:  Films are performed following ultrasound guided biopsy of a hypoechoic area at 10 o'clock 7 cm from the right nipple.  The coil clip is appropriately positioned.  IMPRESSION: Appropriate clip placement following ultrasound-guided core needle biopsy of a hypoechoic area at 10 o'clock 7 cm from the right nipple.  Original Report Authenticated By: Daryl Eastern, M.D.   Mm Digital Screening  04/04/2012  *RADIOLOGY REPORT*  Clinical Data: Screening.  MAMMOGRAPHIC BILATERAL DIGITAL SCREENING WITH CAD  Findings: Two views of each breast demonstrate scattered fibroglandular.  In the right breast, a possible mass warrants further evaluation with spot compression views and possibly ultrasound.  In the left breast, no masses or malignant type calcifications are identified.  Compared with priors.  Images were processed with CAD.  IMPRESSION: Further evaluation is suggested for possible mass in the right breast.  RECOMMENDATION: Diagnostic mammogram and possibly  ultrasound of the right breast. (Code:FI-R-68M)  BI-RADS CATEGORY 0:  Incomplete.  Need additional imaging evaluation and/or prior mammograms for comparison.  Original Report Authenticated By: Darrol Angel, M.D.      IMPRESSION: Invasive lobular carcinoma of the right breast. I discussed with Jean [name] that her MRI shows 2 other suspicious areas within the right breast. This would require biopsy prior to considering breast conserving therapy. Given this issue the patient would feel more comfortable with proceeding with simple mastectomy. She is not interested in breast conserving therapy at this time. Patient has met with Dr. Magnus Ivan who is in agreement. Patient will also be meeting with Dr. Welton Flakes later today for medical oncology evaluation.  Since patient is proceeding with a mastectomy I anticipate that she will not require radiation therapy as part of her overall management.  PLAN: Simple mastectomy likely followed by adjuvant hormonal therapy.     -----------------------------------------------  Billie Lade, PhD, MD

## 2012-04-30 NOTE — Progress Notes (Signed)
Jean [name] [id number] 1929/07/03 77 y.o. 04/30/2012 12:46 PM  CC  Carolynn Serve, MD No address on file Dr. Abigail Miyamoto Dr. Antony Blackbird  REASON FOR CONSULTATION:  78 year old female with new diagnosis of invasive lobular carcinoma of the right breast. Patient is status post needle core biopsy of the 10:00 mass 7 cm from the nipple performed on 04/22/2012. Tumor was ER +100% PR +100% proliferation marker Ki-67 31% HER-2/neu negative. Patient was seen in the Multidisciplinary Breast Clinic for discussion of her treatment options.   STAGE:   Breast cancer, right   Primary site: Breast   Staging method: AJCC 7th Edition   Clinical: (T1c, N0, cM0)   Summary: (T1c, N0, cM0)  REFERRING PHYSICIAN: Dr. Abigail Miyamoto  HISTORY OF PRESENT ILLNESS:  Jean [name] is a 78 y.o. female With medical history significant for arthritis and hypertension and history of nephrolithiasis. Patient was seen for an annual screening mammogram at the breast Center on 04/04/2012. The mammogram showed a possible mass in the right breast. She went on to have spot compression views performed on 04/22/2012. The mammogram continue to show asymmetric density with central fact in the right upper outer quadrant posteriorly. She had an ultrasound that showed a ill-defined hypoechoic mass at the 10:00 position 7 cm from the right nipple measuring 1.1 x 0.6 x 0.9 cm. Sonography of the right axilla showed no abnormal nodes. Patient went on to have a needle core biopsy performed on 04/22/2012. The pathology revealed a invasive mammary carcinoma he could hear and immunohistochemical stain performed was negative confirming a lobular phenotype. The tumor was estrogen receptor +100% strongly staining progesterone receptor +100% strongly staining proliferation marker Ki-67 31% HER-2/neu was negative with a ratio 1.12. Patient is now seen in the multidisciplinary breast clinic for discussion of treatment options. On 04/25/2012  patient did have MRI of the breasts performed in the right breast in the posterior one third of the upper outer quadrant there was noted to be an irregular enhancing mass measuring 2.0 x 1.8 x 2.0 cm. In the lateral aspect of the right breast there was a stent 1 cm area of linear enhancement and these third area measuring 5 mm enhancing nodule in the central portion of the right breast. There was also noted a 4 mm enhancing nodule in the upper inner quadrant of the right breast. No abnormal enhancement was seen in the left breast no enlarged axillary or internal mammary adenopathy. Patient is without any complaints.   Past Medical History: Past Medical History  Diagnosis Date  . Hypertension   . Breast cancer, right 04/30/2012    Invasive lobular    Past Surgical History: Past Surgical History  Procedure Date  . Abdominal hysterectomy   . Appendectomy   . Joint replacement   . Spine surgery   . Hernia repair     Family History: No family history on file.  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 History  Substance Use Topics  . Smoking status: Never Smoker   . Smokeless tobacco: Not on file  . Alcohol Use: Not on file    Allergies: Allergies  Allergen Reactions  . Cephalexin   . Oxycodone-Aspirin   . Penicillins     Current Medications: Current Outpatient Prescriptions  Medication Sig Dispense Refill  . amLODipine-valsartan (EXFORGE) 10-320 MG per tablet Take 1 tablet by mouth daily.      . ferrous sulfate  325 (65 FE) MG tablet Take 325 mg by mouth daily with breakfast.      . hydrALAZINE (APRESOLINE) 25 MG tablet Take 25 mg by mouth 3 (three) times daily.      . meloxicam (MOBIC) 15 MG tablet Take 15 mg by mouth daily.      . nadolol (CORGARD) 40 MG tablet Take 40 mg by mouth daily.      . potassium chloride (K-DUR) 10 MEQ tablet Take 20 mEq by mouth daily.      . pravastatin (PRAVACHOL) 40  MG tablet Take 40 mg by mouth daily.        OB/GYN History:Menarche at age 47 patient is postmenopausal she has never been on hormone replacement therapy. Patient is nulliparas Her children are adopted.  Fertility Discussion: N/A Prior History of Cancer:N/A  Health Maintenance:  Colonoscopy 2 years ago Bone Density Patient has had a bone density but she cannot recall when the last one was. Last PAP smear Unknown  ECOG PERFORMANCE STATUS: 1 - Symptomatic but completely ambulatory  Genetic Counseling/testing: Patient's family history is reviewed and at this time genetic counseling and testing is not recommended.  REVIEW OF SYSTEMS:  Constitutional: positive for fatigue Ears, nose, mouth, throat, and face: positive for nasal congestion Respiratory: positive for dyspnea on exertion Cardiovascular: negative Gastrointestinal: negative Integument/breast: positive for breast tenderness Hematologic/lymphatic: positive for easy bruising Musculoskeletal:positive for arthralgias and stiff joints Neurological: negative  PHYSICAL EXAMINATION: Blood pressure 210/85, pulse 60, temperature 98.2 F (36.8 C), height 5\' 1"  (1.549 m), weight 149 lb 6.4 oz (67.767 kg).  AVW:UJWJX, healthy, no distress, well nourished and well developed SKIN: skin color, texture, turgor are normal HEAD: Normocephalic EYES: PERRLA, EOMI, Conjunctiva are pink and non-injected, sclera clear EARS: External ears normal OROPHARYNX:no exudate and no erythema  NECK: supple, no adenopathy, thyroid normal size, non-tender, without nodularity LYMPH:  no palpable lymphadenopathy, no hepatosplenomegaly BREAST:left breast normal without mass, skin or nipple changes or axillary nodes, abnormal mass palpable Which likely is a hematoma on the right side from the biopsy otherwise no other changes LUNGS: clear to auscultation and percussion HEART: regular rate & rhythm and no murmurs ABDOMEN:abdomen soft, non-tender, obese,  normal bowel sounds and no masses or organomegaly BACK: Back symmetric, no curvature. EXTREMITIES:no edema, no clubbing, no cyanosis  NEURO: alert & oriented x 3 with fluent speech, no focal motor/sensory deficits, gait normal     STUDIES/RESULTS: US Breast Right  04/22/2012  *RADIOLOGY REPORT*  Clinical Data:  The patient returns for evaluation of a possible mass in the right upper outer quadrant posteriorly noted on recent screening study dated 04/04/2012.  DIGITAL DIAGNOSTIC RIGHT LIMITED MAMMOGRAM  AND RIGHT BREAST ULTRASOUND:  Comparison:  04/02/2011, 02/27/2010, 02/25/2009  Findings:  Additional views demonstrate an asymmetric density with central fat in the right upper outer quadrant posteriorly.  On physical exam, no mass is palpated in the right upper outer quadrant.  Ultrasound is performed, showing an ill-defined hypoechoic mass at 10 o'clock 7 cm in the right nipple measuring 1.1 x 0.6 x 0.9 cm. Sonography of the right axilla demonstrates no abnormal nodes. This may represent stromal fibrosis but invasive mammary carcinoma is not excluded.  Ultrasound-guided core needle biopsy is recommended.  IMPRESSION: Ill-defined hypoechoic mass at 10 o'clock 7 cm from the right nipple.  Ultrasound-guided core needle biopsy is suggested.  This will be performed and reported separately.  BI-RADS CATEGORY 4:  Suspicious abnormality - biopsy should be considered.  Original Report Authenticated  By: Daryl Eastern, M.[name].   Mr Breast Bilateral W Wo Contrast  04/28/2012  *RADIOLOGY REPORT*  Clinical Data: Recently diagnosed right breast invasive lobular carcinoma  BUN and creatinine were obtained on site at [facility] at [address] Results:  BUN 12 mg/dL,  Creatinine 1.1 mg/dL.  BILATERAL BREAST MRI WITH AND WITHOUT CONTRAST  Technique: Multiplanar, multisequence MR images of both breasts were obtained prior to and following the intravenous administration of 15ml of multihance.  Three  dimensional images were evaluated at the independent DynaCad workstation.  Comparison:  Mammograms dated 04/22/2012 and 04/04/2012  Findings: There is a moderate background parenchymal enhancement pattern.  Right breast: 1.  In the posterior one third of the upper outer quadrant of the right breast there is an irregular, enhancing mass measuring 2.0 x 1.8 x 2.0 cm.  It is associated with a clip artifact. 2.  In the lateral aspect of the right breast there is a 1 cm area of linear enhancement. 3.  There is a 5 mm enhancing nodule in the central portion of the right breast. 4.  There is a 4 mm enhancing nodule in the upper inner quadrant of the right breast.  No abnormal enhancement is seen in the left  breast.  There is no enlarged axillary or internal mammary adenopathy.  IMPRESSION:  1.  2 cm enhancing mass in the upper outer quadrant of the right breast corresponding well with the known malignancy. 2. In addition two enhancing nodules and one area of linear enhancement are seen in the right breast.  I would recommend MR guided core biopsies of the central nodule and the linear area of enhancement. 3.  No abnormal enhancement in the left breast.  RECOMMENDATION: BI-RADS CATEGORY 4:  Suspicious abnormality - biopsy should be considered.  THREE-DIMENSIONAL MR IMAGE RENDERING ON INDEPENDENT WORKSTATION:  Three-dimensional MR images were rendered by post-processing of the original MR data on an independent workstation.  The three- dimensional MR images were interpreted, and findings were reported in the accompanying complete MRI report for this study.  Original Report Authenticated By: Littie Deeds. Judyann Munson, M.[name].   [place] Core Biopsy  04/23/2012  *RADIOLOGY REPORT*  Clinical Data:  Ill-defined hypoechoic mass at 10 o'clock 7 cm from the right nipple.  ULTRASOUND GUIDED VACUUM ASSISTED CORE BIOPSY OF THE RIGHT BREAST  The patient and I discussed the procedure of ultrasound-guided biopsy, including benefits and alternatives.  We  discussed the high likelihood of a successful procedure. We discussed the risks of the procedure including infection, bleeding, tissue injury, clip migration, and inadequate sampling.  Informed written consent was given.  Using sterile technique, 2% lidocaine ultrasound guidance and a 12 gauge vacuum assisted needle, biopsy was performed of the mass at 10 o'clock 7 cm from the right nipple.  At the conclusion of the procedure, a coil tissue marker clip was deployed into the biopsy cavity.  Follow-up 2-view mammogram was performed and dictated separately.  Histologic evaluation demonstrates grade I - II invasive mammary carcinoma.  E-cadherin stains to determine if this is ductal or lobular carcinoma are pending. This is concordant with the imaging findings.  Results were discussed with the patient by telephone at her request.  She reports no complications from the procedure.  The patient requests consultation with the [facility].  This has been scheduled for 04/30/2012. Preoperative breast MRI will be attempted on 04/28/2012.  The patient does have some claustrophobia so this may not be possible  IMPRESSION: Ultrasound-guided biopsy of a mass at 10 o'clock 7 cm from the right nipple.  Grade I-II invasive mammary carcinoma is diagnosed. Breast MRI will be attempted on 04/28/2012.  The patient has been scheduled to be seen in the [facility] on 04/30/2012.  No apparent complications.  Original Report Authenticated By: Daryl Eastern, M.[name].   Mm Digital Diag Ltd R  04/22/2012  *RADIOLOGY REPORT*  Clinical Data:  The patient returns for evaluation of a possible mass in the right upper outer quadrant posteriorly noted on recent screening study dated 04/04/2012.  DIGITAL DIAGNOSTIC RIGHT LIMITED MAMMOGRAM  AND RIGHT BREAST ULTRASOUND:  Comparison:  04/02/2011, 02/27/2010, 02/25/2009  Findings:  Additional views demonstrate an asymmetric density  with central fat in the right upper outer quadrant posteriorly.  On physical exam, no mass is palpated in the right upper outer quadrant.  Ultrasound is performed, showing an ill-defined hypoechoic mass at 10 o'clock 7 cm in the right nipple measuring 1.1 x 0.6 x 0.9 cm. Sonography of the right axilla demonstrates no abnormal nodes. This may represent stromal fibrosis but invasive mammary carcinoma is not excluded.  Ultrasound-guided core needle biopsy is recommended.  IMPRESSION: Ill-defined hypoechoic mass at 10 o'clock 7 cm from the right nipple.  Ultrasound-guided core needle biopsy is suggested.  This will be performed and reported separately.  BI-RADS CATEGORY 4:  Suspicious abnormality - biopsy should be considered.  Original Report Authenticated By: Daryl Eastern, M.[name].   Mm Digital Diagnostic Unilat R  04/22/2012  *RADIOLOGY REPORT*  Clinical Data:  Ultrasound-guided core needle biopsy of a hypoechoic area at 10 o'clock 7 cm from the right nipple with clip placement.  DIGITAL DIAGNOSTIC RIGHT MAMMOGRAM  Comparison:  None.  Findings:  Films are performed following ultrasound guided biopsy of a hypoechoic area at 10 o'clock 7 cm from the right nipple.  The coil clip is appropriately positioned.  IMPRESSION: Appropriate clip placement following ultrasound-guided core needle biopsy of a hypoechoic area at 10 o'clock 7 cm from the right nipple.  Original Report Authenticated By: Daryl Eastern, M.[name].   Mm Digital Screening  04/04/2012  *RADIOLOGY REPORT*  Clinical Data: Screening.  MAMMOGRAPHIC BILATERAL DIGITAL SCREENING WITH CAD  Findings: Two views of each breast demonstrate scattered fibroglandular.  In the right breast, a possible mass warrants further evaluation with spot compression views and possibly ultrasound.  In the left breast, no masses or malignant type calcifications are identified.  Compared with priors.  Images were processed with CAD.  IMPRESSION: Further evaluation is suggested  for possible mass in the right breast.  RECOMMENDATION: Diagnostic mammogram and possibly ultrasound of the right breast. (Code:FI-R-18M)  BI-RADS CATEGORY 0:  Incomplete.  Need additional imaging evaluation and/or prior mammograms for comparison.  Original Report Authenticated By: Darrol Angel, M.[name].     LABS:    Chemistry      Component Value Date/Time   NA 136 04/30/2012 0806   K 3.6 04/30/2012 0806   CL 101 04/30/2012 0806   CO2 25 04/30/2012 0806   BUN 14 04/30/2012 0806   CREATININE 0.85 04/30/2012 0806      Component Value Date/Time   CALCIUM 9.6 04/30/2012 0806   ALKPHOS 71 04/30/2012 0806   AST 22 04/30/2012 0806   ALT 14 04/30/2012 0806   BILITOT 0.3 04/30/2012 0806      Lab Results  Component Value Date   WBC 13.6* 04/30/2012   HGB 12.3 04/30/2012   HCT 36.9  04/30/2012   MCV 89.5 04/30/2012   PLT 273 04/30/2012    PATHOLOGY: REASON FOR ADDENDUM, AMENDMENT OR CORRECTION: [id number]: E-cadherin stain results. An E-cadherin immunohistochemical stain is performed which is essentially negative confirming the lobular nature of the tumor. 04/24/12 11:41:57 AM (gt) ADDITIONAL INFORMATION: CHROMOGENIC IN-SITU HYBRIDIZATION Interpretation HER-2/NEU BY CISH - NO AMPLIFICATION OF HER-2 DETECTED. THE RATIO OF HER-2: CEP 17 SIGNALS WAS 1.12. Reference range: Ratio: HER2:CEP17 < 1.8 - gene amplification not observed Ratio: HER2:CEP 17 1.8-2.2 - equivocal result Ratio: HER2:CEP17 > 2.2 - gene amplification observed Abigail Miyamoto MD Pathologist, Electronic Signature ( Signed 04/25/2012) PROGNOSTIC INDICATORS - ACIS Results IMMUNOHISTOCHEMICAL AND MORPHOMETRIC ANALYSIS BY THE AUTOMATED CELLULAR IMAGING SYSTEM (ACIS) Estrogen Receptor (Negative, <1%): 100%,POSITIVE, STRONG STAINING INTENSITY Progesterone Receptor (Negative, <1%): 100%,POSITIVE, STRONG STAINING INTENSITY Proliferation Marker Ki67 by M IB-1 (Low<20%): 31% 1 of 3 Amended copy Addendum FINAL for Jean [name], Jean [name]  [phone]) ADDITIONAL INFORMATION:(continued) All controls stained appropriately  ASSESSMENT    78 year old female with  #1 new diagnosis of invasive lobular carcinoma of the right breast that is ER positive PR positive HER-2/neu negative. By MRI the area measures 2 cm in greatest dimension. On the MRI she was also found to have several other areas of concern. Patient's case was discussed at the multidisciplinary breast conference and recommendation for biopsies of these areas was recommended especially if the patient is thinking about having breast conservation.  #2 arthritis patient is currently on Mobic.  Clinical Trial Eligibility:I reviewed the protocols we have available there is no clinical trial that this patient would be eligible for.  Multidisciplinary conference discussion Recommendation of additional biopsies of the masses found on MRI was recommended.    PLAN:    #1 patient and I had an extensive discussion about her mammograms MRI results. We also discussed her pathology from the needle core biopsy. Rationale for additional biopsies were discussed with the patient. However the patient has opted for a mastectomy. She had an extensive discussion with Dr. Rayburn Ma regarding this. We also discussed reconstruction and patient at this time does not want to have any kind of reconstruction performed. She states that she would be just as happy with a prosthesis.  #2 the patient and I discussed adjuvant treatment I think she would be an excellent candidate for adjuvant antiestrogen therapy. We discussed aromatase inhibitors as well as tamoxifen. I did share with them my concern of AI's since patient does have history of arthritis she may develop worsening of her arthritic pain. We discussed the side effects of aromatase inhibitors In detail. However at this time we will keep this discussion open until she returns in followup after her surgery.  #3 patient will be seen back in 4-5  weeks time..       Discussion: Patient is being treated per NCCN breast cancer care guidelines appropriate for stage.We made management recommendations based on NCCN guidelines for stage I invasive lobular carcinoma. The guidelines recommend considering adjuvant antiestrogen therapy for ER/PR positive invasive cancers.   Thank you so much for allowing me to participate in the care of Jean [name]. I will continue to follow up the patient with you and assist in her care.  All questions were answered. The patient knows to call the clinic with any problems, questions or concerns. We can certainly see the patient much sooner if necessary.  I spent 60 minutes counseling the patient face to face. The total time spent in the appointment was 60 minutes.  Drue Second, MD Medical/Oncology [facility] [phone] (beeper) [phone] (Office)  04/30/2012, 12:46 PM

## 2012-04-30 NOTE — Telephone Encounter (Signed)
Made patient appointment for 06-02-2012 10:00am printed out calendar and gave to the patient

## 2012-05-05 ENCOUNTER — Telehealth: Payer: Self-pay | Admitting: *Deleted

## 2012-05-05 NOTE — Telephone Encounter (Signed)
Spoke to pt concerning [facility] from 6/19.  Pt denies questions or concerns regarding dx or treatment care plan.  Pt did ask if she could take BP medication morning of surgery.  Informed pt I would ask Dr. Eliberto Ivory nurse.  Encourage pt to call with further questions.  Informed pt I will call her back once I heard from Dr. Eliberto Ivory nurse.  Contact information given.

## 2012-05-07 ENCOUNTER — Encounter: Payer: Self-pay | Admitting: *Deleted

## 2012-05-07 NOTE — Progress Notes (Signed)
Mailed after appt letter to pt. 

## 2012-05-08 ENCOUNTER — Other Ambulatory Visit: Payer: Medicare Other

## 2012-05-12 ENCOUNTER — Encounter (HOSPITAL_BASED_OUTPATIENT_CLINIC_OR_DEPARTMENT_OTHER)
Admission: RE | Admit: 2012-05-12 | Discharge: 2012-05-12 | Disposition: A | Discharge status: Home / Self Care | Payer: Medicare Other | Source: Ambulatory Visit | Attending: Surgery | Admitting: Surgery

## 2012-05-12 ENCOUNTER — Encounter (HOSPITAL_BASED_OUTPATIENT_CLINIC_OR_DEPARTMENT_OTHER): Payer: Self-pay | Admitting: *Deleted

## 2012-05-12 LAB — BASIC METABOLIC PANEL
BUN: 18 mg/dL (ref 6–23)
Calcium: 9.6 mg/dL (ref 8.4–10.5)
Creatinine, Ser: 0.96 mg/dL (ref 0.50–1.10)
GFR calc non Af Amer: 54 mL/min — ABNORMAL LOW (ref >90)
Glucose, Bld: 148 mg/dL — ABNORMAL HIGH (ref 70–99)
Sodium: 137 mEq/L (ref 135–145)

## 2012-05-12 NOTE — Progress Notes (Signed)
To come in for ekg and bmet Bring all meds and overnight bag

## 2012-05-13 ENCOUNTER — Encounter: Payer: Self-pay | Admitting: *Deleted

## 2012-05-13 NOTE — Progress Notes (Signed)
[facility] Psychosocial Distress Screening Clinical Social Work  Patient completed distress screening protocol.  The patient scored a 10 on the Psychosocial Distress Thermometer which indicates severe distress.  Patient and Family support team member met with pt in [facility] to assess for distress and other psychosocial needs.  Appropriate information and interventions were provided.  Pt reported her distress level had decreased to a level 1 after meeting with multidisciplinary team.        Tamala Julian, MSW, LCSW Clinical Social Worker [facility] [phone]

## 2012-05-15 NOTE — H&P (Signed)
Chief Complaint   Patient presents with   .  Other     right breast cancer    HPI  Jean [name] is a 78 y.o. female. Pleasant female referred by Dr. Felix [name] in for evaluation of a recent diagnosis of right breast cancer found on screening mammography. She has had no previous problems with her breast. She denies nipple discharge. She has no other complaints.  HPI  Past Medical History   Diagnosis  Date   .  Hypertension     Past Surgical History   Procedure  Date   .  Abdominal hysterectomy    .  Appendectomy    .  Joint replacement    .  Spine surgery    .  Hernia repair     History reviewed. No pertinent family history.  Social History  History   Substance Use Topics   .  Smoking status:  Not on file   .  Smokeless tobacco:  Not on file   .  Alcohol Use:     Allergies   Allergen  Reactions   .  Cephalexin    .  Oxycodone-Aspirin    .  Penicillins     Current Outpatient Prescriptions   Medication  Sig  Dispense  Refill   .  amLODipine-valsartan (EXFORGE) 10-320 MG per tablet  Take 1 tablet by mouth daily.     .  ferrous sulfate 325 (65 FE) MG tablet  Take 325 mg by mouth daily with breakfast.     .  hydrALAZINE (APRESOLINE) 25 MG tablet  Take 25 mg by mouth 3 (three) times daily.     .  meloxicam (MOBIC) 15 MG tablet  Take 15 mg by mouth daily.     .  nadolol (CORGARD) 40 MG tablet  Take 40 mg by mouth daily.     .  potassium chloride (K-DUR) 10 MEQ tablet  Take 20 mEq by mouth daily.     .  pravastatin (PRAVACHOL) 40 MG tablet  Take 40 mg by mouth daily.      Review of Systems  Review of Systems  Constitutional: Negative for fever, chills and unexpected weight change.  HENT: Negative for hearing loss, congestion, sore throat, trouble swallowing and voice change.  Eyes: Negative for visual disturbance.  Respiratory: Negative for cough and wheezing.  Cardiovascular: Negative for chest pain, palpitations and leg swelling.  Gastrointestinal: Negative for  nausea, vomiting, abdominal pain, diarrhea, constipation, blood in stool, abdominal distention and anal bleeding.  Genitourinary: Negative for hematuria, vaginal bleeding and difficulty urinating.  Musculoskeletal: Negative for arthralgias.  Skin: Negative for rash and wound.  Neurological: Negative for seizures, syncope and headaches.  Hematological: Negative for adenopathy. Does not bruise/bleed easily.  Psychiatric/Behavioral: Negative for confusion.   Blood pressure 120/70, pulse 72, temperature 98 F (36.7 C), resp. rate 16.  Physical Exam  Physical Exam  Constitutional: She is oriented to person, place, and time. She appears well-developed and well-nourished. No distress.  HENT:  Head: Normocephalic and atraumatic.  Right Ear: External ear normal.  Left Ear: External ear normal.  Nose: Nose normal.  Mouth/Throat: Oropharynx is clear and moist. No oropharyngeal exudate.  Eyes: Conjunctivae are normal. Pupils are equal, round, and reactive to light. Right eye exhibits no discharge. Left eye exhibits no discharge. No scleral icterus.  Neck: Normal range of motion. Neck supple. No tracheal deviation present. No thyromegaly present.  Cardiovascular: Normal rate, regular rhythm, normal heart sounds and  intact distal pulses.  No murmur heard.  Pulmonary/Chest: Effort normal and breath sounds normal. No respiratory distress. She has no wheezes. She has no rales.  Abdominal: Soft. Bowel sounds are normal. She exhibits no distension. There is no tenderness. There is no rebound.  Musculoskeletal: Normal range of motion. She exhibits no edema and no tenderness.  Lymphadenopathy:  She has no cervical adenopathy.  Neurological: She is alert and oriented to person, place, and time.  Skin: Skin is warm and dry. No rash noted. She is not diaphoretic. No erythema.  Psychiatric: Her behavior is normal. Thought content normal.   Data Reviewed  I have reviewed her mammograms, MRI, and biopsy  results showing ER/PR positive lobular cancer of the right breast. There are 2 other suspicious areas in the right breast on MRI  Assessment   Invasive lobular right breast cancer 2 other suspicious areas.   Plan   The patient already came here to the breast clinic with a decision that she wanted to go ahead and proceed with a right simple mastectomy rather than biopsied these other areas. We have discussed this and feel this is reasonable. I discussed  mastectomy with her in detail. I discussed the risks of surgery which includes but not limited to bleeding, infection, seroma formation, injury to surround structures, need for further surgery, et Karie Soda. We are not planning sentinel lymph node biopsy at this time. I also discussed reconstruction with her and she has declined. Right simple mastectomy will thus be scheduled. Likelihood of success is good.   Jean [name] A

## 2012-05-16 ENCOUNTER — Encounter (HOSPITAL_BASED_OUTPATIENT_CLINIC_OR_DEPARTMENT_OTHER): Payer: Self-pay | Admitting: Anesthesiology

## 2012-05-16 ENCOUNTER — Encounter (HOSPITAL_BASED_OUTPATIENT_CLINIC_OR_DEPARTMENT_OTHER)
Admission: RE | Disposition: A | Discharge status: Home / Self Care | Payer: Self-pay | Source: Ambulatory Visit | Attending: Surgery

## 2012-05-16 ENCOUNTER — Ambulatory Visit (HOSPITAL_BASED_OUTPATIENT_CLINIC_OR_DEPARTMENT_OTHER)
Admission: RE | Admit: 2012-05-16 | Discharge: 2012-05-17 | Disposition: A | Discharge status: Home / Self Care | Payer: Medicare Other | Source: Ambulatory Visit | Attending: Surgery | Admitting: Surgery

## 2012-05-16 ENCOUNTER — Encounter (HOSPITAL_BASED_OUTPATIENT_CLINIC_OR_DEPARTMENT_OTHER): Payer: Self-pay | Admitting: *Deleted

## 2012-05-16 ENCOUNTER — Ambulatory Visit (HOSPITAL_BASED_OUTPATIENT_CLINIC_OR_DEPARTMENT_OTHER): Payer: Medicare Other | Admitting: Anesthesiology

## 2012-05-16 DIAGNOSIS — D0590 Unspecified type of carcinoma in situ of unspecified breast: Secondary | ICD-10-CM | POA: Insufficient documentation

## 2012-05-16 DIAGNOSIS — C50919 Malignant neoplasm of unspecified site of unspecified female breast: Secondary | ICD-10-CM | POA: Insufficient documentation

## 2012-05-16 DIAGNOSIS — L821 Other seborrheic keratosis: Secondary | ICD-10-CM | POA: Insufficient documentation

## 2012-05-16 DIAGNOSIS — I10 Essential (primary) hypertension: Secondary | ICD-10-CM | POA: Insufficient documentation

## 2012-05-16 DIAGNOSIS — Z01810 Encounter for preprocedural cardiovascular examination: Secondary | ICD-10-CM | POA: Insufficient documentation

## 2012-05-16 HISTORY — PX: MASTECTOMY: SHX3

## 2012-05-16 HISTORY — DX: Hyperlipidemia, unspecified: E78.5

## 2012-05-16 HISTORY — DX: Unspecified osteoarthritis, unspecified site: M19.90

## 2012-05-16 LAB — POCT HEMOGLOBIN-HEMACUE: Hemoglobin: 14.5 g/dL (ref 12.0–15.0)

## 2012-05-16 SURGERY — SIMPLE MASTECTOMY
Anesthesia: General | Site: Breast | Laterality: Right | Wound class: Clean

## 2012-05-16 MED ORDER — OXYCODONE HCL 5 MG PO TABS: 5.0000 mg | ORAL_TABLET | Freq: Once | ORAL | Status: AC | PRN

## 2012-05-16 MED ORDER — DEXAMETHASONE SODIUM PHOSPHATE 4 MG/ML IJ SOLN
INTRAMUSCULAR | Status: DC | PRN
  Administered 2012-05-16: 10 mg via INTRAVENOUS

## 2012-05-16 MED ORDER — ONDANSETRON HCL 4 MG/2ML IJ SOLN: 4.0000 mg | Freq: Four times a day (QID) | INTRAMUSCULAR | Status: DC | PRN

## 2012-05-16 MED ORDER — POTASSIUM CHLORIDE IN NACL 20-0.9 MEQ/L-% IV SOLN: INTRAVENOUS | Status: DC

## 2012-05-16 MED ORDER — CIPROFLOXACIN IN D5W 400 MG/200ML IV SOLN
400.0000 mg | INTRAVENOUS | Status: AC
  Administered 2012-05-16: 400 mg via INTRAVENOUS

## 2012-05-16 MED ORDER — PROPOFOL 10 MG/ML IV EMUL
INTRAVENOUS | Status: DC | PRN
  Administered 2012-05-16: 150 mg via INTRAVENOUS

## 2012-05-16 MED ORDER — HYDRALAZINE HCL 25 MG PO TABS
25.0000 mg | ORAL_TABLET | Freq: Two times a day (BID) | ORAL | Status: DC
  Administered 2012-05-16: 25 mg via ORAL

## 2012-05-16 MED ORDER — NADOLOL 40 MG PO TABS
40.0000 mg | ORAL_TABLET | Freq: Every day | ORAL | Status: DC
  Administered 2012-05-17: 40 mg via ORAL

## 2012-05-16 MED ORDER — HYDROCODONE-ACETAMINOPHEN 5-325 MG PO TABS
1.0000 | ORAL_TABLET | ORAL | Status: DC | PRN
  Administered 2012-05-16 – 2012-05-17 (×4): 2 via ORAL

## 2012-05-16 MED ORDER — METOCLOPRAMIDE HCL 5 MG/ML IJ SOLN: 10.0000 mg | Freq: Once | INTRAMUSCULAR | Status: AC | PRN

## 2012-05-16 MED ORDER — ACETAMINOPHEN 10 MG/ML IV SOLN
1000.0000 mg | Freq: Once | INTRAVENOUS | Status: AC
  Administered 2012-05-16: 1000 mg via INTRAVENOUS

## 2012-05-16 MED ORDER — LIDOCAINE HCL (CARDIAC) 20 MG/ML IV SOLN
INTRAVENOUS | Status: DC | PRN
  Administered 2012-05-16: 50 mg via INTRAVENOUS

## 2012-05-16 MED ORDER — METOCLOPRAMIDE HCL 5 MG/ML IJ SOLN
INTRAMUSCULAR | Status: DC | PRN
  Administered 2012-05-16: 10 mg via INTRAVENOUS

## 2012-05-16 MED ORDER — TEMAZEPAM 15 MG PO CAPS
15.0000 mg | ORAL_CAPSULE | Freq: Every evening | ORAL | Status: AC | PRN
  Administered 2012-05-16: 30 mg via ORAL

## 2012-05-16 MED ORDER — MORPHINE SULFATE 2 MG/ML IJ SOLN
2.0000 mg | INTRAMUSCULAR | Status: DC | PRN
Start: 2012-05-16 — End: 2012-05-17
  Administered 2012-05-16 (×2): 1 mg via INTRAVENOUS
  Administered 2012-05-17: 2 mg via INTRAVENOUS

## 2012-05-16 MED ORDER — KCL IN DEXTROSE-NACL 20-5-0.45 MEQ/L-%-% IV SOLN
INTRAVENOUS | Status: DC
  Administered 2012-05-16: 13:00:00 via INTRAVENOUS

## 2012-05-16 MED ORDER — ONDANSETRON HCL 4 MG PO TABS: 4.0000 mg | ORAL_TABLET | Freq: Four times a day (QID) | ORAL | Status: DC | PRN

## 2012-05-16 MED ORDER — FENTANYL CITRATE 0.05 MG/ML IJ SOLN
25.0000 ug | INTRAMUSCULAR | Status: DC | PRN
  Administered 2012-05-16 (×3): 25 ug via INTRAVENOUS

## 2012-05-16 MED ORDER — HYDROCODONE-ACETAMINOPHEN 5-325 MG PO TABS: 1.0000 | ORAL_TABLET | Freq: Four times a day (QID) | ORAL | Status: AC | PRN

## 2012-05-16 MED ORDER — FENTANYL CITRATE 0.05 MG/ML IJ SOLN
INTRAMUSCULAR | Status: DC | PRN
  Administered 2012-05-16 (×4): 25 ug via INTRAVENOUS

## 2012-05-16 MED ORDER — AMLODIPINE BESYLATE-VALSARTAN 10-320 MG PO TABS: 1.0000 | ORAL_TABLET | Freq: Every day | ORAL | Status: DC

## 2012-05-16 MED ORDER — LACTATED RINGERS IV SOLN
INTRAVENOUS | Status: DC
  Administered 2012-05-16: 10:00:00 via INTRAVENOUS

## 2012-05-16 MED ORDER — ENOXAPARIN SODIUM 40 MG/0.4ML ~~LOC~~ SOLN: 40.0000 mg | SUBCUTANEOUS | Status: DC

## 2012-05-16 MED ORDER — MELOXICAM 15 MG PO TABS
15.0000 mg | ORAL_TABLET | Freq: Every day | ORAL | Status: DC
  Administered 2012-05-17: 15 mg via ORAL

## 2012-05-16 MED ORDER — ONDANSETRON HCL 4 MG/2ML IJ SOLN
INTRAMUSCULAR | Status: DC | PRN
  Administered 2012-05-16: 4 mg via INTRAVENOUS

## 2012-05-16 SURGICAL SUPPLY — 52 items
APPLIER CLIP 9.375 MED OPEN (MISCELLANEOUS) ×2
BENZOIN TINCTURE PRP APPL 2/3 (GAUZE/BANDAGES/DRESSINGS) ×2 IMPLANT
BINDER BREAST LRG (GAUZE/BANDAGES/DRESSINGS) ×2 IMPLANT
BLADE HEX COATED 2.75 (ELECTRODE) ×2 IMPLANT
BLADE SURG 15 STRL LF DISP TIS (BLADE) ×1 IMPLANT
BLADE SURG 15 STRL SS (BLADE) ×1
BLADE SURG ROTATE 9660 (MISCELLANEOUS) IMPLANT
CANISTER SUCTION 1200CC (MISCELLANEOUS) ×2 IMPLANT
CHLORAPREP W/TINT 26ML (MISCELLANEOUS) ×2 IMPLANT
CLIP APPLIE 9.375 MED OPEN (MISCELLANEOUS) ×1 IMPLANT
CLOTH BEACON ORANGE TIMEOUT ST (SAFETY) ×2 IMPLANT
COVER MAYO STAND STRL (DRAPES) ×2 IMPLANT
COVER TABLE BACK 60X90 (DRAPES) ×2 IMPLANT
DECANTER SPIKE VIAL GLASS SM (MISCELLANEOUS) IMPLANT
DRAIN CHANNEL 19F RND (DRAIN) ×2 IMPLANT
DRAPE LAPAROSCOPIC ABDOMINAL (DRAPES) ×2 IMPLANT
DRAPE UTILITY XL STRL (DRAPES) ×2 IMPLANT
ELECT REM PT RETURN 9FT ADLT (ELECTROSURGICAL) ×2
ELECTRODE REM PT RTRN 9FT ADLT (ELECTROSURGICAL) ×1 IMPLANT
EVACUATOR SILICONE 100CC (DRAIN) ×2 IMPLANT
GAUZE SPONGE 4X4 12PLY STRL LF (GAUZE/BANDAGES/DRESSINGS) ×6 IMPLANT
GLOVE BIO SURGEON STRL SZ7 (GLOVE) ×2 IMPLANT
GLOVE BIOGEL PI IND STRL 7.0 (GLOVE) ×1 IMPLANT
GLOVE BIOGEL PI INDICATOR 7.0 (GLOVE) ×1
GLOVE SKINSENSE NS SZ7.0 (GLOVE) ×1
GLOVE SKINSENSE STRL SZ7.0 (GLOVE) ×1 IMPLANT
GLOVE SURG SIGNA 7.5 PF LTX (GLOVE) ×2 IMPLANT
GOWN PREVENTION PLUS XLARGE (GOWN DISPOSABLE) ×2 IMPLANT
GOWN PREVENTION PLUS XXLARGE (GOWN DISPOSABLE) ×4 IMPLANT
HEMOSTAT SURGICEL 2X14 (HEMOSTASIS) IMPLANT
NDL SAFETY ECLIPSE 18X1.5 (NEEDLE) IMPLANT
NEEDLE HYPO 18GX1.5 SHARP (NEEDLE)
NEEDLE HYPO 25X1 1.5 SAFETY (NEEDLE) ×2 IMPLANT
NS IRRIG 1000ML POUR BTL (IV SOLUTION) ×2 IMPLANT
PACK BASIN DAY SURGERY FS (CUSTOM PROCEDURE TRAY) ×2 IMPLANT
PENCIL BUTTON HOLSTER BLD 10FT (ELECTRODE) ×2 IMPLANT
PIN SAFETY STERILE (MISCELLANEOUS) ×2 IMPLANT
SLEEVE SCD COMPRESS KNEE MED (MISCELLANEOUS) ×2 IMPLANT
SPONGE LAP 18X18 X RAY DECT (DISPOSABLE) ×2 IMPLANT
STRIP CLOSURE SKIN 1/2X4 (GAUZE/BANDAGES/DRESSINGS) ×2 IMPLANT
SUT ETHILON 3 0 PS 1 (SUTURE) ×2 IMPLANT
SUT MNCRL AB 4-0 PS2 18 (SUTURE) ×4 IMPLANT
SUT VIC AB 3-0 SH 27 (SUTURE) ×2
SUT VIC AB 3-0 SH 27X BRD (SUTURE) ×2 IMPLANT
SYR BULB 3OZ (MISCELLANEOUS) ×2 IMPLANT
SYR CONTROL 10ML LL (SYRINGE) ×2 IMPLANT
TAPE CLOTH SURG 4X10 WHT LF (GAUZE/BANDAGES/DRESSINGS) ×2 IMPLANT
TOWEL OR 17X24 6PK STRL BLUE (TOWEL DISPOSABLE) ×2 IMPLANT
TOWEL OR NON WOVEN STRL DISP B (DISPOSABLE) ×2 IMPLANT
TUBE CONNECTING 20X1/4 (TUBING) ×2 IMPLANT
WATER STERILE IRR 1000ML POUR (IV SOLUTION) IMPLANT
YANKAUER SUCT BULB TIP NO VENT (SUCTIONS) ×2 IMPLANT

## 2012-05-16 NOTE — Anesthesia Procedure Notes (Signed)
Procedure Name: LMA Insertion Performed by: Sharnita Bogucki W Pre-anesthesia Checklist: Patient identified, Timeout performed, Emergency Drugs available, Suction available and Patient being monitored Patient Re-evaluated:Patient Re-evaluated prior to inductionOxygen Delivery Method: Circle system utilized Preoxygenation: Pre-oxygenation with 100% oxygen Intubation Type: IV induction Ventilation: Mask ventilation without difficulty LMA: LMA inserted LMA Size: 4.0 Number of attempts: 1 Placement Confirmation: breath sounds checked- equal and bilateral and positive ETCO2 Tube secured with: Tape Dental Injury: Teeth and Oropharynx as per pre-operative assessment      

## 2012-05-16 NOTE — Transfer of Care (Signed)
Immediate Anesthesia Transfer of Care Note  Patient: Jean [name]  Procedure(s) Performed: Procedure(s) (LRB): SIMPLE MASTECTOMY (Right)  Patient Location: PACU  Anesthesia Type: General  Level of Consciousness: awake, alert  and oriented  Airway & Oxygen Therapy: Patient Spontanous Breathing and Patient connected to face mask oxygen  Post-op Assessment: Report given to PACU RN and Post -op Vital signs reviewed and stable  Post vital signs: Reviewed and stable  Complications: No apparent anesthesia complications

## 2012-05-16 NOTE — Op Note (Signed)
SIMPLE MASTECTOMY  Procedure Note  Jean [name] 05/16/2012   Pre-op Diagnosis: right breast cancer     Post-op Diagnosis: same  Procedure(s): RIGHT SIMPLE MASTECTOMY  Surgeon(s): Shelly Rubenstein, MD  Anesthesia: General  Staff:  Aleen Sells, RN - Circulator Macie Burows Ricks, RN - Scrub Person  Estimated Blood Loss: Minimal               Specimens: right breast         Indications: This is an 78 year old with multifocal right breast invasive cancer. The decision has been made to proceed with right simple mastectomy  Procedure: The patient was brought to the operating room and identified as the correct patient. She was placed supine on the operating room table and general anesthesia was induced. Her right breast and chest were prepped and draped in the usual sterile fashion. I made [name] longitudinal elliptical incision on the right breast going medial to lateral and incorporating the areola and nipple complex. I took this down into the breast tissue with the electrocautery. I first dissected the superior skin flaps with the cautery. I took this all the way down to the level of the chest wall and clavicle. I then dissected the inferior flap going down past the inframammary ridge with the cautery. Good viable skin flaps appear to be achieved. I then dissected the breast tissue off the chest wall with the electrocautery moving medial to lateral. I completed dissection laterally with the cautery removing the tissue to completely simple mastectomy. I then marked the skin with [name] stitch laterally and the specimen was sent to pathology for evaluation. I then irrigated the chest wall with normal saline. I achieved hemostasis with the cautery. I made [name] separate skin incision and placed [name] 19 [place] Blake drain into the incision. I sewed this in place with [name] nylon suture. I then closed the subcutaneous tissue with interrupted 3-0 Vicryl sutures and closed the skin with [name] running 4-0  Monocryl. Steri-Strips, gauze, tape, and [name] binder were placed. The patient tolerated the procedure well. All counts were correct at the end of the procedure. The patient was then extubated in the operating room and taken in [name] stable condition to the recovery room. Jean [name]   Date: 05/16/2012  Time: 11:08 AM

## 2012-05-16 NOTE — Interval H&P Note (Signed)
History and Physical Interval Note:  No change  05/16/2012 9:20 AM  Jean [name]  has presented today for surgery, with the diagnosis of right breast cancer  The various methods of treatment have been discussed with the patient and family. After consideration of risks, benefits and other options for treatment, the patient has consented to  Procedure(s) (LRB): SIMPLE MASTECTOMY (Right) as a surgical intervention .  The patient's history has been reviewed, patient examined, no change in status, stable for surgery.  I have reviewed the patients' chart and labs.  Questions were answered to the patient's satisfaction.     Sharmeka Palmisano A

## 2012-05-16 NOTE — Anesthesia Preprocedure Evaluation (Signed)
Anesthesia Evaluation  Patient identified by MRN, date of birth, ID band Patient awake    Reviewed: Allergy & Precautions, H&P , NPO status , Patient's Chart, lab work & pertinent test results, reviewed documented beta blocker date and time   Airway Mallampati: II TM Distance: >3 FB Neck ROM: full    Dental   Pulmonary neg pulmonary ROS,          Cardiovascular hypertension, On Medications and On Home Beta Blockers     Neuro/Psych negative neurological ROS  negative psych ROS   GI/Hepatic negative GI ROS, Neg liver ROS,   Endo/Other  negative endocrine ROS  Renal/GU negative Renal ROS  negative genitourinary   Musculoskeletal   Abdominal   Peds  Hematology negative hematology ROS (+)   Anesthesia Other Findings See surgeon's H&P   Reproductive/Obstetrics negative OB ROS                           Anesthesia Physical Anesthesia Plan  ASA: II  Anesthesia Plan: General   Post-op Pain Management:    Induction: Intravenous  Airway Management Planned: LMA  Additional Equipment:   Intra-op Plan:   Post-operative Plan: Extubation in OR  Informed Consent: I have reviewed the patients History and Physical, chart, labs and discussed the procedure including the risks, benefits and alternatives for the proposed anesthesia with the patient or authorized representative who has indicated his/her understanding and acceptance.   Dental Advisory Given  Plan Discussed with: CRNA and Surgeon  Anesthesia Plan Comments:         Anesthesia Quick Evaluation

## 2012-05-16 NOTE — Anesthesia Postprocedure Evaluation (Signed)
Anesthesia Post Note  Patient: Jean [name]  Procedure(s) Performed: Procedure(s) (LRB): SIMPLE MASTECTOMY (Right)  Anesthesia type: General  Patient location: PACU  Post pain: Pain level controlled  Post assessment: Patient's Cardiovascular Status Stable  Last Vitals:  Filed Vitals:   05/16/12 1300  BP: 139/72  Pulse: 50  Temp: 36.4 C  Resp: 18    Post vital signs: Reviewed and stable  Level of consciousness: alert  Complications: No apparent anesthesia complications

## 2012-05-23 ENCOUNTER — Ambulatory Visit (INDEPENDENT_AMBULATORY_CARE_PROVIDER_SITE_OTHER): Payer: Medicare Other | Admitting: General Surgery

## 2012-05-23 ENCOUNTER — Encounter (INDEPENDENT_AMBULATORY_CARE_PROVIDER_SITE_OTHER): Payer: Self-pay | Admitting: General Surgery

## 2012-05-23 VITALS — BP 128/66 | HR 60 | Temp 97.8°F | Resp 16 | Ht 64.0 in | Wt 148.8 lb

## 2012-05-23 DIAGNOSIS — Z9889 Other specified postprocedural states: Secondary | ICD-10-CM

## 2012-05-23 NOTE — Progress Notes (Signed)
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 She is not having much pain. Drain output is about 4.5 ounces a day.  PE: Right chest wall-incision is clean, dry, and intact. Drain output is serous  Assessment:  Right breast cancer status post right simple mastectomy-drain output to high to remove at this time.  Plan:  Call when drain output is 30-40 cc per day and removal can be arranged at that time.. Followup appointment with Dr. Magnus Ivan in 2 weeks for

## 2012-05-23 NOTE — Patient Instructions (Signed)
Call when drainage is 30-40 cc per day.  Change bandage every other day.

## 2012-06-02 ENCOUNTER — Encounter (INDEPENDENT_AMBULATORY_CARE_PROVIDER_SITE_OTHER): Payer: Self-pay | Admitting: Surgery

## 2012-06-02 ENCOUNTER — Ambulatory Visit (INDEPENDENT_AMBULATORY_CARE_PROVIDER_SITE_OTHER): Payer: Medicare Other | Admitting: Surgery

## 2012-06-02 VITALS — BP 130/62 | HR 72 | Temp 98.2°F | Resp 14 | Ht 64.0 in | Wt 148.0 lb

## 2012-06-02 DIAGNOSIS — Z09 Encounter for follow-up examination after completed treatment for conditions other than malignant neoplasm: Secondary | ICD-10-CM

## 2012-06-02 NOTE — Progress Notes (Signed)
Subjective:     Patient ID: Jean [name], female   DOB: [birth year], 78 y.o.   MRN: [id number]  HPI She is here for her first postoperative visit status post right simple mastectomy. She is doing well and has no complaints  Review of Systems     Objective:   Physical Exam    On exam, her incision is healing well. I removed her drain. Her final pathology showed the lobular carcinoma in multiple locations in the right breast. All margins were negative Assessment:     Patient status post right simple mastectomy for lobular carcinoma    Plan:     I will see her back in approximately 3 weeks

## 2012-06-10 ENCOUNTER — Telehealth: Payer: Self-pay | Admitting: *Deleted

## 2012-06-10 ENCOUNTER — Encounter: Payer: Self-pay | Admitting: Oncology

## 2012-06-10 ENCOUNTER — Ambulatory Visit (HOSPITAL_BASED_OUTPATIENT_CLINIC_OR_DEPARTMENT_OTHER): Payer: Medicare Other | Admitting: Oncology

## 2012-06-10 VITALS — BP 166/71 | HR 60 | Temp 98.4°F | Ht 64.0 in | Wt 146.6 lb

## 2012-06-10 DIAGNOSIS — C50911 Malignant neoplasm of unspecified site of right female breast: Secondary | ICD-10-CM

## 2012-06-10 DIAGNOSIS — Z9010 Acquired absence of unspecified breast and nipple: Secondary | ICD-10-CM

## 2012-06-10 DIAGNOSIS — C50419 Malignant neoplasm of upper-outer quadrant of unspecified female breast: Secondary | ICD-10-CM

## 2012-06-10 DIAGNOSIS — Z170 Estrogen receptor positive status [ER+]: Secondary | ICD-10-CM

## 2012-06-10 MED ORDER — ANASTROZOLE 1 MG PO TABS: 1.0000 mg | ORAL_TABLET | Freq: Every day | ORAL | Status: AC

## 2012-06-10 NOTE — Telephone Encounter (Signed)
Gave patient appointment for 09-10-2012 starting at 10:00am

## 2012-06-10 NOTE — Progress Notes (Signed)
OFFICE PROGRESS NOTE  CC  [name],[name], MD No address on file Dr. Carman Ching  DIAGNOSIS: 78 year old female with new diagnosis of invasive lobular carcinoma status post mastectomy with the final pathology revealing a 2.7 cm, grade 2 ER positive PR positive HER-2/neu negative invasive lobular carcinoma of the right breast.  PRIOR THERAPY:  #1 patient was originally seen in the multidisciplinary breast clinic for new diagnosis of invasive lobular carcinoma. Since then she has undergone a right mastectomy with the final pathology showing a 2.7 cm ER positive PR positive HER-2/neu negative invasive lobular carcinoma. Sentinel node biopsy was not performed.  #2 patient will now begin adjuvant antiestrogen therapy consisting of Arimidex 1 mg daily.  CURRENT THERAPY:Arimidex 1 mg daily starting 06/10/2012  INTERVAL HISTORY: Jean [name] 78 y.o. female returns for Followup visit after having had her mastectomy about 2-3 weeks ago. Overall she is doing well her final path did show a 2.7 cm invasive lobular carcinoma that was strongly estrogen receptor positive all margins were negative. Postoperatively she is doing quite well she is really without any complaints. She had her drain pulled about a week ago. She is noted to have some accumulation of what looks like a seroma. She is going to be seeing Dr. Rayburn Ma hopefully later this afternoon or tomorrow. She has a little bit of pain around the incision. She otherwise denies any fevers chills night sweats headaches no chest pains or shortness of breath no bleeding. Remainder of the 10 point review of systems is negative.  MEDICAL HISTORY: Past Medical History  Diagnosis Date  . Hypertension   . Breast cancer, right 04/30/2012    Invasive lobular  . Arthritis   . Hyperlipemia     ALLERGIES:  is allergic to cephalexin; penicillins; and avelox.  MEDICATIONS:  Current Outpatient Prescriptions  Medication Sig Dispense Refill  .  amLODipine-valsartan (EXFORGE) 10-320 MG per tablet Take 1 tablet by mouth daily.      . ferrous sulfate 325 (65 FE) MG tablet Take 325 mg by mouth daily with breakfast.      . hydrALAZINE (APRESOLINE) 25 MG tablet Take 25 mg by mouth 2 (two) times daily.       Marland Kitchen HYDROcodone-acetaminophen (NORCO/VICODIN) 5-325 MG per tablet       . meloxicam (MOBIC) 15 MG tablet Take 15 mg by mouth daily.      . nadolol (CORGARD) 40 MG tablet Take 40 mg by mouth daily.      . potassium chloride (K-DUR) 10 MEQ tablet Take 20 mEq by mouth daily.      . pravastatin (PRAVACHOL) 40 MG tablet Take 40 mg by mouth daily.        SURGICAL HISTORY:  Past Surgical History  Procedure Date  . Abdominal hysterectomy   . Appendectomy   . Joint replacement   . Spine surgery   . Hernia repair   . Mastectomy 05/16/12    right breast     REVIEW OF SYSTEMS:  Pertinent items are noted in HPI.   PHYSICAL EXAMINATION:  Gen.: Awake alert in no acute distress well developed well nourished female she is accompanied by her daughter HEENT exam: EOMI PERRLA sclerae anicteric no conjunctival pallor oral mucosa is moist neck is supple no palpable cervical supraclavicular or axillary adenopathy Lungs: Clear to auscultation Cardiovascular regular rate rhythm with 2/6 murmur Abdomen soft nontender nondistended bowel sounds are present no HSM Extremities +1 edema Neuro patient's alert oriented otherwise nonfocal Right incision is healing well there  is noted to be a collection of seroma but there is no leakage.  ECOG PERFORMANCE STATUS: 0 - Asymptomatic  Blood pressure 166/71, pulse 60, temperature 98.4 F (36.9 C), temperature source Oral, height 5\' 4"  (1.626 m), weight 146 lb 9.6 oz (66.497 kg).  LABORATORY DATA: Lab Results  Component Value Date   WBC 13.6* 04/30/2012   HGB 14.5 05/16/2012   HCT 36.9 04/30/2012   MCV 89.5 04/30/2012   PLT 273 04/30/2012      Chemistry      Component Value Date/Time   NA 137 05/12/2012 1330     K 4.3 05/12/2012 1330   CL 102 05/12/2012 1330   CO2 23 05/12/2012 1330   BUN 18 05/12/2012 1330   CREATININE 0.96 05/12/2012 1330      Component Value Date/Time   CALCIUM 9.6 05/12/2012 1330   ALKPHOS 71 04/30/2012 0806   AST 22 04/30/2012 0806   ALT 14 04/30/2012 0806   BILITOT 0.3 04/30/2012 0806     FINAL for RACHELANNE, WHIDBY ([id number]) Patient Name: Jean [name] Accession #: [id number] DOB: Mar 22, 1929 Age: 66 Gender: F Client Name [place]. [facility] Collected Date: 05/16/2012 Received Date: 05/16/2012 Physician: Abigail Miyamoto, MD Chart #: MRN # : [id number] Physician cc: Kaylyn Lim, RN Cain Saupe, MD Race:W Visit #: [id number] REPORT OF SURGICAL PATHOLOGY ADDITIONAL INFORMATION: CHROMOGENIC IN-SITU HYBRIDIZATION Interpretation HER-2/NEU BY CISH - NO AMPLIFICATION OF HER-2 DETECTED. THE RATIO OF HER-2: CEP 17 SIGNALS WAS 1.06. Reference range: Ratio: HER2:CEP17 < 1.8 - gene amplification not observed Ratio: HER2:CEP 17 1.8-2.2 - equivocal result Ratio: HER2:CEP17 > 2.2 - gene amplification observed Abigail Miyamoto MD Pathologist, Electronic Signature ( Signed 05/22/2012) FINAL DIAGNOSIS Diagnosis Breast, simple mastectomy, Right - INVASIVE LOBULAR CARCINOMA, GRADE II (2.7 CM), SEE COMMENT. - NO LYMPHOVASCULAR INVASION IDENTIFIED. - INVASIVE TUMOR IS 1 CM FROM THE NEAREST MARGIN (DEEP). - LOBULAR CARCINOMA IN SITU, GRADE 2-3 - DUCTAL CARCINOMA IN SITU, GRADE 2 - BENIGN SKIN, NEGATIVE FOR TUMOR. - MULTIPLE BENIGN SEBORRHEIC KERATOSES. - SEE TUMOR SYNOPTIC TEMPLATE. Microscopic Comment BREAST, INVASIVE TUMOR, WITH LYMPH NODE SAMPLING 1 of 3 FINAL for MYRLE, [name] [phone]) Microscopic Comment(continued) Specimen, including laterality: Right breast. Procedure: Simple mastectomy. Grade: II of III. Tubule formation: 3. Nuclear pleomorphism: 3. Mitotic: 1. Tumor size (gross measurement): 2.7 cm. Margins: Invasive, distance to closest margin: 1  cm. In-situ, distance to closest margin: 1 cm (deep). If margin positive, focally or broadly: N/A. Lymphovascular invasion: Absent. Ductal carcinoma in situ: Present Grade: 2 of 3 Extensive intraductal component: Absent Lobular neoplasia: Present ( in situ carcinoma and atypical lobular hyperplasia ). Tumor focality: Unifocal. Treatment effect: None. If present, treatment effect in breast tissue, lymph nodes or both: N/A. Extent of tumor: Skin: Negative for tumor. Nipple: Negative for tumor. Skeletal muscle: Negative for tumor. Lymph nodes: # examined: 0. Lymph nodes with metastasis: N/A. Breast prognostic profile: Estrogen receptor: Not repeated; previous study demonstrated 100% positivity ([id number]). Progesterone receptor: Not repeated; previous study demonstrated 100% positivity ([id number]). Her 2 neu: Repeated; previous study demonstrated no amplification (1.12) ([id number]). Ki-67: Not repeated; previous study demonstrated 31% proliferation rate ([id number]). Non-neoplastic breast: Fibroadenomatoid nodules, fibrocystic change, sclerosing adenosis, and previous biopsy site changes. TNM: pT2, pNX, pMX. Comments: None. (CRR:eps 05/19/12)  RADIOGRAPHIC STUDIES:  No results found.  ASSESSMENT: 78 year old female with  #1Stage II (2.7 cm ) invasive lobular carcinoma of the right breast status post mastectomy. ER positive PR positive HER-2/neu negative. Patient is a  good candidate for adjuvant antiestrogen therapy. We discussed pros and cons and rationale of Arimidex treatment. Both she and her daughter are in agreement with starting it. We discussed the risks as well the side effects. She was given literature.  #2 patient does have a right-sided seroma I have referred her to Dr. Rayburn Ma for further evaluation she may need to have that evacuated.   PLAN:   #1 proceed with Arimidex 1 mg daily. A prescription was sent to her pharmacy.  #2 I have set her up to be seen by  me in 3 months time with blood work including CBC and CMET. However I did recommend that if she starts having myalgias and arthralgias or any other side effects she is to contact me immediately so we can see her sooner if need arises.   All questions were answered. The patient knows to call the clinic with any problems, questions or concerns. We can certainly see the patient much sooner if necessary.  I spent 25 minutes counseling the patient face to face. The total time spent in the appointment was 30 minutes.    Drue Second, MD Medical/Oncology [facility] [phone] (beeper) [phone] (Office)  06/10/2012, 10:44 AM

## 2012-06-10 NOTE — Patient Instructions (Addendum)
Proceed with arimidex 1 mg daily   Anastrozole tablets What is this medicine? ANASTROZOLE (an AS troe zole) is used to treat breast cancer in women who have gone through menopause. Some types of breast cancer depend on estrogen to grow, and this medicine can stop tumor growth by blocking estrogen production. This medicine may be used for other purposes; ask your health care provider or pharmacist if you have questions. What should I tell my health care provider before I take this medicine? They need to know if you have any of these conditions: -liver disease -an unusual or allergic reaction to anastrozole, other medicines, foods, dyes, or preservatives -pregnant or trying to get pregnant -breast-feeding How should I use this medicine? Take this medicine by mouth with a glass of water. Follow the directions on the prescription label. You can take this medicine with or without food. Take your doses at regular intervals. Do not take your medicine more often than directed. Do not stop taking except on the advice of your doctor or health care professional. Talk to your pediatrician regarding the use of this medicine in children. Special care may be needed. Overdosage: If you think you have taken too much of this medicine contact a poison control center or emergency room at once. NOTE: This medicine is only for you. Do not share this medicine with others. What if I miss a dose? If you miss a dose, take it as soon as you can. If it is almost time for your next dose, take only that dose. Do not take double or extra doses. What may interact with this medicine? Do not take this medicine with any of the following medications: -female hormones, like estrogens or progestins and birth control pills This medicine may also interact with the following medications: -tamoxifen This list may not describe all possible interactions. Give your health care provider a list of all the medicines, herbs,  non-prescription drugs, or dietary supplements you use. Also tell them if you smoke, drink alcohol, or use illegal drugs. Some items may interact with your medicine. What should I watch for while using this medicine? Visit your doctor or health care professional for regular checks on your progress. Let your doctor or health care professional know about any unusual vaginal bleeding. Do not treat yourself for diarrhea, nausea, vomiting or other side effects. Ask your doctor or health care professional for advice. What side effects may I notice from receiving this medicine? Side effects that you should report to your doctor or health care professional as soon as possible: -allergic reactions like skin rash, itching or hives, swelling of the face, lips, or tongue -any new or unusual symptoms -breathing problems -chest pain -leg pain or swelling -vomiting Side effects that usually do not require medical attention (report to your doctor or health care professional if they continue or are bothersome): -back or bone pain -cough, or throat infection -diarrhea or constipation -dizziness -headache -hot flashes -loss of appetite -nausea -sweating -weakness and tiredness -weight gain This list may not describe all possible side effects. Call your doctor for medical advice about side effects. You may report side effects to FDA at 1-800-FDA-1088. Where should I keep my medicine? Keep out of the reach of children. Store at room temperature between 20 and 25 degrees C (68 and 77 degrees F). Throw away any unused medicine after the expiration date. NOTE: This sheet is a summary. It may not cover all possible information. If you have questions about this medicine, talk  to your doctor, pharmacist, or health care provider.  2012, [employer]. (01/09/2008 4:31:52 PM)   I will see you back in 3 months

## 2012-06-11 ENCOUNTER — Ambulatory Visit (INDEPENDENT_AMBULATORY_CARE_PROVIDER_SITE_OTHER): Payer: Medicare Other | Admitting: Surgery

## 2012-06-11 ENCOUNTER — Encounter (INDEPENDENT_AMBULATORY_CARE_PROVIDER_SITE_OTHER): Payer: Self-pay | Admitting: Surgery

## 2012-06-11 VITALS — BP 132/76 | HR 72 | Temp 98.2°F | Ht 64.0 in | Wt 147.8 lb

## 2012-06-11 DIAGNOSIS — Z09 Encounter for follow-up examination after completed treatment for conditions other than malignant neoplasm: Secondary | ICD-10-CM

## 2012-06-11 NOTE — Progress Notes (Signed)
Subjective:     Patient ID: Jean [name], female   DOB: 06/05/1929, 78 y.o.   MRN: [id number]  HPI  She is here for another postop visit. She feels that the seroma has developed. Review of Systems     Objective:   Physical Exam    There is a moderate seroma the right mastectomy site. I inserted an 18-gauge needle and drained approximately 150 cc of seroma fluid Assessment:     Postop seroma status post mastectomy    Plan:     I will see her back on August 7 at her already scheduled appointment

## 2012-06-18 ENCOUNTER — Ambulatory Visit (INDEPENDENT_AMBULATORY_CARE_PROVIDER_SITE_OTHER): Payer: Medicare Other | Admitting: Surgery

## 2012-06-18 ENCOUNTER — Encounter (INDEPENDENT_AMBULATORY_CARE_PROVIDER_SITE_OTHER): Payer: Medicare Other | Admitting: Surgery

## 2012-06-18 ENCOUNTER — Encounter (INDEPENDENT_AMBULATORY_CARE_PROVIDER_SITE_OTHER): Payer: Self-pay | Admitting: Surgery

## 2012-06-18 VITALS — BP 116/74 | HR 69 | Temp 99.6°F | Ht 64.0 in | Wt 147.2 lb

## 2012-06-18 DIAGNOSIS — Z09 Encounter for follow-up examination after completed treatment for conditions other than malignant neoplasm: Secondary | ICD-10-CM

## 2012-06-18 NOTE — Progress Notes (Signed)
Subjective:     Patient ID: Jean [name], female   DOB: March 12, 1929, 78 y.o.   MRN: [id number]  HPI She is here for another postop visit. She feels like the seroma is recurring but she has no pain  Review of Systems     Objective:   Physical Exam The seroma has recurred at the right mastectomy site. I drained approximately 120 cc of seroma fluid.    Assessment:     Postop seroma status post mastectomy    Plan:     I will see her back in 2 weeks

## 2012-06-26 ENCOUNTER — Other Ambulatory Visit: Payer: Self-pay | Admitting: Orthopedic Surgery

## 2012-06-26 DIAGNOSIS — M545 Low back pain: Secondary | ICD-10-CM

## 2012-06-27 ENCOUNTER — Ambulatory Visit
Admission: RE | Admit: 2012-06-27 | Discharge: 2012-06-27 | Disposition: A | Discharge status: Home / Self Care | Payer: Medicare Other | Source: Ambulatory Visit | Attending: Orthopedic Surgery | Admitting: Orthopedic Surgery

## 2012-06-27 DIAGNOSIS — M545 Low back pain: Secondary | ICD-10-CM

## 2012-07-01 ENCOUNTER — Ambulatory Visit (INDEPENDENT_AMBULATORY_CARE_PROVIDER_SITE_OTHER): Payer: Medicare Other | Admitting: Surgery

## 2012-07-01 ENCOUNTER — Encounter (INDEPENDENT_AMBULATORY_CARE_PROVIDER_SITE_OTHER): Payer: Self-pay | Admitting: Surgery

## 2012-07-01 VITALS — BP 130/87 | HR 84 | Temp 99.0°F | Resp 22 | Ht 64.0 in | Wt 148.8 lb

## 2012-07-01 DIAGNOSIS — Z09 Encounter for follow-up examination after completed treatment for conditions other than malignant neoplasm: Secondary | ICD-10-CM

## 2012-07-01 NOTE — Progress Notes (Signed)
Subjective:     Patient ID: Jean [name], female   DOB: [birth year], 78 y.o.   MRN: [id number]  HPI She is here for another visit. She has no complaints and feels like a seroma has resolved  Review of Systems     Objective:   Physical Exam The right mastectomy incision is well healed and there is no evidence of seroma    Assessment:     Patient status post right simple mastectomy    Plan:     She will be seeing the oncologist in October. I will see her back in 6 months

## 2012-07-02 ENCOUNTER — Other Ambulatory Visit: Payer: Self-pay | Admitting: Orthopedic Surgery

## 2012-07-02 DIAGNOSIS — M549 Dorsalgia, unspecified: Secondary | ICD-10-CM

## 2012-07-03 ENCOUNTER — Other Ambulatory Visit: Payer: Self-pay | Admitting: Orthopedic Surgery

## 2012-07-03 ENCOUNTER — Ambulatory Visit
Admission: RE | Admit: 2012-07-03 | Discharge: 2012-07-03 | Disposition: A | Discharge status: Home / Self Care | Payer: Medicare Other | Source: Ambulatory Visit | Attending: Orthopedic Surgery | Admitting: Orthopedic Surgery

## 2012-07-03 DIAGNOSIS — M549 Dorsalgia, unspecified: Secondary | ICD-10-CM

## 2012-07-03 MED ORDER — METHYLPREDNISOLONE ACETATE 40 MG/ML INJ SUSP (RADIOLOG
120.0000 mg | Freq: Once | INTRAMUSCULAR | Status: AC
  Administered 2012-07-03: 120 mg via EPIDURAL

## 2012-07-03 MED ORDER — IOHEXOL 180 MG/ML  SOLN
1.0000 mL | Freq: Once | INTRAMUSCULAR | Status: AC | PRN
  Administered 2012-07-03: 1 mL via EPIDURAL

## 2012-09-10 ENCOUNTER — Ambulatory Visit (HOSPITAL_BASED_OUTPATIENT_CLINIC_OR_DEPARTMENT_OTHER): Payer: Medicare Other | Admitting: Oncology

## 2012-09-10 ENCOUNTER — Other Ambulatory Visit (HOSPITAL_BASED_OUTPATIENT_CLINIC_OR_DEPARTMENT_OTHER): Payer: Medicare Other | Admitting: Lab

## 2012-09-10 ENCOUNTER — Telehealth: Payer: Self-pay | Admitting: Oncology

## 2012-09-10 ENCOUNTER — Encounter: Payer: Self-pay | Admitting: Oncology

## 2012-09-10 VITALS — BP 160/70 | HR 64 | Temp 98.2°F | Resp 20 | Ht 64.0 in | Wt 151.8 lb

## 2012-09-10 DIAGNOSIS — C50419 Malignant neoplasm of upper-outer quadrant of unspecified female breast: Secondary | ICD-10-CM

## 2012-09-10 DIAGNOSIS — Z170 Estrogen receptor positive status [ER+]: Secondary | ICD-10-CM

## 2012-09-10 DIAGNOSIS — C50911 Malignant neoplasm of unspecified site of right female breast: Secondary | ICD-10-CM

## 2012-09-10 LAB — CBC WITH DIFFERENTIAL/PLATELET
BASO%: 0.4 % (ref 0.0–2.0)
Basophils Absolute: 0 10*3/uL (ref 0.0–0.1)
EOS%: 2.3 % (ref 0.0–7.0)
HCT: 37.2 % (ref 34.8–46.6)
HGB: 12.9 g/dL (ref 11.6–15.9)
MONO#: 0.9 10*3/uL (ref 0.1–0.9)
NEUT#: 6.2 10*3/uL (ref 1.5–6.5)
NEUT%: 66.1 % (ref 38.4–76.8)
RDW: 14 % (ref 11.2–14.5)
WBC: 9.4 10*3/uL (ref 3.9–10.3)
lymph#: 2.1 10*3/uL (ref 0.9–3.3)

## 2012-09-10 LAB — COMPREHENSIVE METABOLIC PANEL (CC13)
ALT: 12 U/L (ref 0–55)
AST: 19 U/L (ref 5–34)
Albumin: 3.8 g/dL (ref 3.5–5.0)
BUN: 17 mg/dL (ref 7.0–26.0)
CO2: 27 mEq/L (ref 22–29)
Calcium: 9.8 mg/dL (ref 8.4–10.4)
Chloride: 108 mEq/L — ABNORMAL HIGH (ref 98–107)
Creatinine: 1 mg/dL (ref 0.6–1.1)
Potassium: 3.6 mEq/L (ref 3.5–5.1)

## 2012-09-10 MED ORDER — ANASTROZOLE 1 MG PO TABS: 1.0000 mg | ORAL_TABLET | Freq: Every day | ORAL | Status: AC

## 2012-09-10 NOTE — Patient Instructions (Addendum)
Continue arimidex daily  We will see you back in February in January 09, 2013

## 2012-09-10 NOTE — Progress Notes (Signed)
OFFICE PROGRESS NOTE  CC  Jean [name],[name], MD No address on file Dr. Carman Ching  DIAGNOSIS: 78 year old female with new diagnosis of invasive lobular carcinoma status post mastectomy with the final pathology revealing a 2.7 cm, grade 2 ER positive PR positive HER-2/neu negative invasive lobular carcinoma of the right breast.  PRIOR THERAPY:  #1 patient was originally seen in the multidisciplinary breast clinic for new diagnosis of invasive lobular carcinoma. Since then she has undergone a right mastectomy with the final pathology showing a 2.7 cm ER positive PR positive HER-2/neu negative invasive lobular carcinoma. Sentinel node biopsy was not performed.  #2 patient will now begin adjuvant antiestrogen therapy consisting of Arimidex 1 mg daily. A total of 5 years of therapy is planned.  CURRENT THERAPY:Arimidex 1 mg daily starting 06/10/2012  INTERVAL HISTORY: Jean [name] 78 y.o. female returns for Followup visit. Clinically patient is doing well she is without any significant complaints. She does have a pinched nerve. She also is complaining of a little bit of pain at the drain site on the right side. She was seen by Dr. Magnus Ivan and did have her seroma evacuated. She feels much better now. She denies any fevers chills night sweats headaches she has no shortness of breath no chest pains no palpitations she has no aches or pains no myalgias or arthralgias noted from the Arimidex. She has no joint swelling. She denies any vaginal bleeding or discharge. Remainder of the 10 point review of systems is negative. MEDICAL HISTORY: Past Medical History  Diagnosis Date  . Hypertension   . Breast cancer, right 04/30/2012    Invasive lobular  . Arthritis   . Hyperlipemia     ALLERGIES:  is allergic to cephalexin; penicillins; and avelox.  MEDICATIONS:  Current Outpatient Prescriptions  Medication Sig Dispense Refill  . amLODipine-valsartan (EXFORGE) 10-320 MG per tablet Take 1 tablet by  mouth daily.      . butalbital-acetaminophen-caffeine (FIORICET, ESGIC) 50-325-40 MG per tablet       . ferrous sulfate 325 (65 FE) MG tablet Take 325 mg by mouth daily with breakfast.      . hydrALAZINE (APRESOLINE) 25 MG tablet Take 25 mg by mouth 2 (two) times daily.       Marland Kitchen HYDROcodone-acetaminophen (NORCO/VICODIN) 5-325 MG per tablet       . meloxicam (MOBIC) 15 MG tablet Take 15 mg by mouth daily.      . methocarbamol (ROBAXIN) 750 MG tablet       . nadolol (CORGARD) 40 MG tablet Take 40 mg by mouth daily.      Marland Kitchen oxyCODONE-acetaminophen (PERCOCET/ROXICET) 5-325 MG per tablet       . potassium chloride (K-DUR) 10 MEQ tablet Take 20 mEq by mouth daily.      . pravastatin (PRAVACHOL) 40 MG tablet Take 40 mg by mouth daily.      . temazepam (RESTORIL) 15 MG capsule         SURGICAL HISTORY:  Past Surgical History  Procedure Date  . Abdominal hysterectomy   . Appendectomy   . Joint replacement   . Spine surgery   . Hernia repair   . Mastectomy 05/16/12    right breast     REVIEW OF SYSTEMS:  Pertinent items are noted in HPI.   PHYSICAL EXAMINATION:  Gen.: Awake alert in no acute distress well developed well nourished female she is accompanied by her daughter HEENT exam: EOMI PERRLA sclerae anicteric no conjunctival pallor oral mucosa is moist neck  is supple no palpable cervical supraclavicular or axillary adenopathy Lungs: Clear to auscultation Cardiovascular regular rate rhythm with 2/6 murmur Abdomen soft nontender nondistended bowel sounds are present no HSM Extremities +1 edema Neuro patient's alert oriented otherwise nonfocal Right incision is healing well there is noted to be a collection of seroma but there is no leakage.  ECOG PERFORMANCE STATUS: 0 - Asymptomatic  Blood pressure 160/70, pulse 64, temperature 98.2 F (36.8 C), temperature source Oral, resp. rate 20, height 5\' 4"  (1.626 m), weight 151 lb 12.8 oz (68.856 kg).  LABORATORY DATA: Lab Results  Component  Value Date   WBC 9.4 09/10/2012   HGB 12.9 09/10/2012   HCT 37.2 09/10/2012   MCV 89.5 09/10/2012   PLT 260 09/10/2012      Chemistry      Component Value Date/Time   NA 137 05/12/2012 1330   K 4.3 05/12/2012 1330   CL 102 05/12/2012 1330   CO2 23 05/12/2012 1330   BUN 18 05/12/2012 1330   CREATININE 0.96 05/12/2012 1330      Component Value Date/Time   CALCIUM 9.6 05/12/2012 1330   ALKPHOS 71 04/30/2012 0806   AST 22 04/30/2012 0806   ALT 14 04/30/2012 0806   BILITOT 0.3 04/30/2012 0806     FINAL for Jean [name] ([id number]) Patient Name: Jean [name] Accession #: [id number] DOB: August 09, 1929 Age: 15 Gender: F Client Name  [facility] Collected Date: 05/16/2012 Received Date: 05/16/2012 Physician: Abigail Miyamoto, MD Chart #: MRN # : [id number] Physician cc: Kaylyn Lim, RN Cain Saupe, MD Race:W Visit #: [id number] REPORT OF SURGICAL PATHOLOGY ADDITIONAL INFORMATION: CHROMOGENIC IN-SITU HYBRIDIZATION Interpretation HER-2/NEU BY CISH - NO AMPLIFICATION OF HER-2 DETECTED. THE RATIO OF HER-2: CEP 17 SIGNALS WAS 1.06. Reference range: Ratio: HER2:CEP17 < 1.8 - gene amplification not observed Ratio: HER2:CEP 17 1.8-2.2 - equivocal result Ratio: HER2:CEP17 > 2.2 - gene amplification observed Abigail Miyamoto MD Pathologist, Electronic Signature ( Signed 05/22/2012) FINAL DIAGNOSIS Diagnosis Breast, simple mastectomy, Right - INVASIVE LOBULAR CARCINOMA, GRADE II (2.7 CM), SEE COMMENT. - NO LYMPHOVASCULAR INVASION IDENTIFIED. - INVASIVE TUMOR IS 1 CM FROM THE NEAREST MARGIN (DEEP). - LOBULAR CARCINOMA IN SITU, GRADE 2-3 - DUCTAL CARCINOMA IN SITU, GRADE 2 - BENIGN SKIN, NEGATIVE FOR TUMOR. - MULTIPLE BENIGN SEBORRHEIC KERATOSES. - SEE TUMOR SYNOPTIC TEMPLATE. Microscopic Comment BREAST, INVASIVE TUMOR, WITH LYMPH NODE SAMPLING 1 of 3 FINAL for [name], Jean [phone]) Microscopic Comment(continued) Specimen, including laterality: Right  breast. Procedure: Simple mastectomy. Grade: II of III. Tubule formation: 3. Nuclear pleomorphism: 3. Mitotic: 1. Tumor size (gross measurement): 2.7 cm. Margins: Invasive, distance to closest margin: 1 cm. In-situ, distance to closest margin: 1 cm (deep). If margin positive, focally or broadly: N/A. Lymphovascular invasion: Absent. Ductal carcinoma in situ: Present Grade: 2 of 3 Extensive intraductal component: Absent Lobular neoplasia: Present ( in situ carcinoma and atypical lobular hyperplasia ). Tumor focality: Unifocal. Treatment effect: None. If present, treatment effect in breast tissue, lymph nodes or both: N/A. Extent of tumor: Skin: Negative for tumor. Nipple: Negative for tumor. Skeletal muscle: Negative for tumor. Lymph nodes: # examined: 0. Lymph nodes with metastasis: N/A. Breast prognostic profile: Estrogen receptor: Not repeated; previous study demonstrated 100% positivity ([id number]). Progesterone receptor: Not repeated; previous study demonstrated 100% positivity ([id number]). Her 2 neu: Repeated; previous study demonstrated no amplification (1.12) ([id number]). Ki-67: Not repeated; previous study demonstrated 31% proliferation rate ([id number]). Non-neoplastic breast: Fibroadenomatoid nodules, fibrocystic change, sclerosing adenosis, and previous biopsy  site changes. TNM: pT2, pNX, pMX. Comments: None. (CRR:eps 05/19/12)  RADIOGRAPHIC STUDIES:  No results found.  ASSESSMENT: 78 year old female with  #1Stage II (2.7 cm ) invasive lobular carcinoma of the right breast status post mastectomy. ER positive PR positive HER-2/neu negative. Patient is now on Arimidex 1 mg daily tolerating it very well. The total of 5 years of therapy is planned. She has no evidence of recurrent disease.  PLAN:  #1 patient will continue Arimidex on a daily basis. She knows to call me if she starts having side effects. A prescription to her pharmacy was sent today.  #2 we  will plan on seeing her every 4 months in followup. Of course we can see her sooner if need arises.  All questions were answered. The patient knows to call the clinic with any problems, questions or concerns. We can certainly see the patient much sooner if necessary.  I spent 25 minutes counseling the patient face to face. The total time spent in the appointment was 30 minutes.    Drue Second, MD Medical/Oncology [facility] [phone] (beeper) [phone] (Office)  09/10/2012, 10:08 AM

## 2012-09-10 NOTE — Telephone Encounter (Signed)
gve the pt her feb 2014 appt calendar °

## 2012-11-26 ENCOUNTER — Ambulatory Visit (INDEPENDENT_AMBULATORY_CARE_PROVIDER_SITE_OTHER): Payer: Medicare Other | Admitting: Surgery

## 2012-11-26 ENCOUNTER — Encounter (INDEPENDENT_AMBULATORY_CARE_PROVIDER_SITE_OTHER): Payer: Self-pay | Admitting: Surgery

## 2012-11-26 VITALS — BP 152/82 | HR 68 | Resp 16 | Ht 64.0 in | Wt 146.2 lb

## 2012-11-26 DIAGNOSIS — Z853 Personal history of malignant neoplasm of breast: Secondary | ICD-10-CM

## 2012-11-26 NOTE — Progress Notes (Signed)
Subjective:     Patient ID: Jean [name], female   DOB: 1928/11/15, 79 y.o.   MRN: [id number]  HPI She is here for long-term followup. She has some mild discomfort at the drain site was laterally but has no other complaints.  Review of Systems     Objective:   Physical Exam Her right mastectomy incision is well healed. I can palpate no masses. There is no axillary adenopathy.    Assessment:     Patient stable with history of right breast cancer    Plan:     She continues her anti-hormonal therapy. I will see her back in one year

## 2013-01-09 ENCOUNTER — Other Ambulatory Visit (HOSPITAL_BASED_OUTPATIENT_CLINIC_OR_DEPARTMENT_OTHER): Payer: Medicare Other | Admitting: Lab

## 2013-01-09 ENCOUNTER — Encounter: Payer: Self-pay | Admitting: Adult Health

## 2013-01-09 ENCOUNTER — Ambulatory Visit (HOSPITAL_BASED_OUTPATIENT_CLINIC_OR_DEPARTMENT_OTHER): Payer: Medicare Other | Admitting: Adult Health

## 2013-01-09 ENCOUNTER — Telehealth: Payer: Self-pay | Admitting: Oncology

## 2013-01-09 VITALS — BP 151/74 | HR 73 | Temp 98.8°F | Resp 20 | Ht 64.0 in | Wt 151.5 lb

## 2013-01-09 DIAGNOSIS — Z170 Estrogen receptor positive status [ER+]: Secondary | ICD-10-CM

## 2013-01-09 DIAGNOSIS — C50911 Malignant neoplasm of unspecified site of right female breast: Secondary | ICD-10-CM

## 2013-01-09 DIAGNOSIS — C50419 Malignant neoplasm of upper-outer quadrant of unspecified female breast: Secondary | ICD-10-CM

## 2013-01-09 DIAGNOSIS — Z853 Personal history of malignant neoplasm of breast: Secondary | ICD-10-CM

## 2013-01-09 LAB — COMPREHENSIVE METABOLIC PANEL (CC13)
ALT: 13 U/L (ref 0–55)
AST: 20 U/L (ref 5–34)
Albumin: 3.7 g/dL (ref 3.5–5.0)
CO2: 28 mEq/L (ref 22–29)
Calcium: 10.3 mg/dL (ref 8.4–10.4)
Chloride: 101 mEq/L (ref 98–107)
Potassium: 3.4 mEq/L — ABNORMAL LOW (ref 3.5–5.1)
Sodium: 141 mEq/L (ref 136–145)
Total Protein: 7.6 g/dL (ref 6.4–8.3)

## 2013-01-09 LAB — CBC WITH DIFFERENTIAL/PLATELET
BASO%: 0.6 % (ref 0.0–2.0)
EOS%: 1.8 % (ref 0.0–7.0)
HCT: 38 % (ref 34.8–46.6)
MCH: 30.3 pg (ref 25.1–34.0)
MCHC: 34.3 g/dL (ref 31.5–36.0)
MONO#: 1 10*3/uL — ABNORMAL HIGH (ref 0.1–0.9)
RDW: 13 % (ref 11.2–14.5)
WBC: 12.1 10*3/uL — ABNORMAL HIGH (ref 3.9–10.3)
lymph#: 2 10*3/uL (ref 0.9–3.3)

## 2013-01-09 NOTE — Telephone Encounter (Signed)
gv pt appt schedule for September and mammo for 6/30.

## 2013-01-09 NOTE — Patient Instructions (Addendum)
Doing well.  No sign of recurrence.  Continue arimidex.  Please increase your water intake.  We will see you back in 6 months.  I have ordered your diagnostic mammogram for June 2014.

## 2013-01-09 NOTE — Progress Notes (Signed)
OFFICE PROGRESS NOTE  CC  Jean [name],[name], MD No address on file Dr. Carman Ching  DIAGNOSIS: 79 year old female with new diagnosis of invasive lobular carcinoma status post mastectomy with the final pathology revealing a 2.7 cm, grade 2 ER positive PR positive HER-2/neu negative invasive lobular carcinoma of the right breast.  PRIOR THERAPY:  #1 patient was originally seen in the multidisciplinary breast clinic for new diagnosis of invasive lobular carcinoma. Since then she has undergone a right mastectomy with the final pathology showing a 2.7 cm ER positive PR positive HER-2/neu negative invasive lobular carcinoma. Sentinel node biopsy was not performed.  #2 patient will now begin adjuvant antiestrogen therapy consisting of Arimidex 1 mg daily. A total of 5 years of therapy is planned.  CURRENT THERAPY:Arimidex 1 mg daily starting 06/10/2012  INTERVAL HISTORY: Jean [name] 79 y.o. female returns for Followup visit.  She is doing well today.  She continues to have a pinched nerve that Dr. Alvester Morin and Dr. August Saucer at [facility] are treating.  She continues to take her Arimidex daily and she has occasional hot flashes that are tolerable, but denies dryness, joint aches/pains, shortness of breath, chest pain, fevers, chills, nausea, vomiting, or any other concerns.  She has had difficulty with physical activity due to her pinched nerve, but she does stay active in church and with non-profit organizations.  Other than occasional hot flashes and pain from a pinched nerve, a 10 point ROS is neg.   MEDICAL HISTORY: Past Medical History  Diagnosis Date  . Hypertension   . Breast cancer, right 04/30/2012    Invasive lobular  . Arthritis   . Hyperlipemia     ALLERGIES:  is allergic to cephalexin; penicillins; and avelox.  MEDICATIONS:  Current Outpatient Prescriptions  Medication Sig Dispense Refill  . amLODipine-valsartan (EXFORGE) 10-320 MG per tablet Take 1 tablet by mouth daily.       Marland Kitchen anastrozole (ARIMIDEX) 1 MG tablet Take 1 mg by mouth daily.      . butalbital-acetaminophen-caffeine (FIORICET, ESGIC) 50-325-40 MG per tablet       . ferrous sulfate 325 (65 FE) MG tablet Take 325 mg by mouth daily with breakfast.      . hydrALAZINE (APRESOLINE) 25 MG tablet Take 25 mg by mouth 2 (two) times daily.       Marland Kitchen HYDROcodone-acetaminophen (NORCO/VICODIN) 5-325 MG per tablet       . meloxicam (MOBIC) 15 MG tablet Take 15 mg by mouth daily.      . methocarbamol (ROBAXIN) 750 MG tablet       . nadolol (CORGARD) 40 MG tablet Take 20 mg by mouth daily.       Marland Kitchen oxyCODONE-acetaminophen (PERCOCET/ROXICET) 5-325 MG per tablet       . potassium chloride (K-DUR) 10 MEQ tablet Take 20 mEq by mouth daily.      . pravastatin (PRAVACHOL) 40 MG tablet Take 40 mg by mouth daily.      . temazepam (RESTORIL) 15 MG capsule        No current facility-administered medications for this visit.    SURGICAL HISTORY:  Past Surgical History  Procedure Laterality Date  . Abdominal hysterectomy    . Appendectomy    . Joint replacement    . Spine surgery    . Hernia repair    . Mastectomy  05/16/12    right breast     REVIEW OF SYSTEMS:   General: fatigue (-), night sweats (-), fever (-), pain (-) Lymph:  palpable nodes (-) HEENT: vision changes (-), mucositis (-), gum bleeding (-), epistaxis (-) Cardiovascular: chest pain (-), palpitations (-) Pulmonary: shortness of breath (-), dyspnea on exertion (-), cough (-), hemoptysis (-) GI:  Early satiety (-), melena (-), dysphagia (-), nausea/vomiting (-), diarrhea (-) GU: dysuria (-), hematuria (-), incontinence (-) Musculoskeletal: joint swelling (-), joint pain (-), back pain (+) Neuro: weakness (-), numbness (-), headache (-), confusion (-) Skin: Rash (-), lesions (-), dryness (-) Psych: depression (-), suicidal/homicidal ideation (-), feeling of hopelessness (-)  Health Maintenance  Mammogram: 04/2013 Colonoscopy: normal, no f/u needed per  GI Bone Density Scan: Due in 2015 Pap Smear: s/p TAH, physical scheduled in 4/14 Eye Exam: 2 years ago, will schedule in next 6 months Vitamin D Level: never Lipid Panel:scheduled in 4/14   PHYSICAL EXAMINATION:  BP 151/74  Pulse 73  Temp(Src) 98.8 F (37.1 C) (Oral)  Resp 20  Ht 5\' 4"  (1.626 m)  Wt 151 lb 8 oz (68.72 kg)  BMI 25.99 kg/m2 Gen.: Awake alert in no acute distress well developed well nourished female she is accompanied by her daughter HEENT exam: EOMI PERRLA sclerae anicteric no conjunctival pallor oral mucosa is moist neck is supple no palpable cervical supraclavicular or axillary adenopathy Lungs: Clear to auscultation Cardiovascular regular rate rhythm with 2/6 murmur Abdomen soft nontender nondistended bowel sounds are present no HSM Extremities +1 edema Neuro patient's alert oriented otherwise nonfocal Right mastectomy site well healed, no nodularity, or sign of recurrence, left breast without mass, nodularity or skin changes.   ECOG PERFORMANCE STATUS: 0 - Asymptomatic  LABORATORY DATA: Lab Results  Component Value Date   WBC 12.1* 01/09/2013   HGB 13.0 01/09/2013   HCT 38.0 01/09/2013   MCV 88.3 01/09/2013   PLT 275 01/09/2013      Chemistry      Component Value Date/Time   NA 141 01/09/2013 0943   NA 137 05/12/2012 1330   K 3.4* 01/09/2013 0943   K 4.3 05/12/2012 1330   CL 101 01/09/2013 0943   CL 102 05/12/2012 1330   CO2 28 01/09/2013 0943   CO2 23 05/12/2012 1330   BUN 15.9 01/09/2013 0943   BUN 18 05/12/2012 1330   CREATININE 1.2* 01/09/2013 0943   CREATININE 0.96 05/12/2012 1330      Component Value Date/Time   CALCIUM 10.3 01/09/2013 0943   CALCIUM 9.6 05/12/2012 1330   ALKPHOS 82 01/09/2013 0943   ALKPHOS 71 04/30/2012 0806   AST 20 01/09/2013 0943   AST 22 04/30/2012 0806   ALT 13 01/09/2013 0943   ALT 14 04/30/2012 0806   BILITOT 0.55 01/09/2013 0943   BILITOT 0.3 04/30/2012 0806     FINAL for KAWANNA, CHRISTLEY ([id number]) Patient Name: ASCENCION, STEGNER  Accession #: [id number] DOB: 04-10-1929 Age: 57 Gender: F Client Name [place]. [facility] Collected Date: 05/16/2012 Received Date: 05/16/2012 Physician: Abigail Miyamoto, MD Chart #: MRN # : [id number] Physician cc: Kaylyn Lim, RN Cain Saupe, MD Race:W Visit #: [id number] REPORT OF SURGICAL PATHOLOGY ADDITIONAL INFORMATION: CHROMOGENIC IN-SITU HYBRIDIZATION Interpretation HER-2/NEU BY CISH - NO AMPLIFICATION OF HER-2 DETECTED. THE RATIO OF HER-2: CEP 17 SIGNALS WAS 1.06. Reference range: Ratio: HER2:CEP17 < 1.8 - gene amplification not observed Ratio: HER2:CEP 17 1.8-2.2 - equivocal result Ratio: HER2:CEP17 > 2.2 - gene amplification observed Abigail Miyamoto MD Pathologist, Electronic Signature ( Signed 05/22/2012) FINAL DIAGNOSIS Diagnosis Breast, simple mastectomy, Right - INVASIVE LOBULAR CARCINOMA, GRADE II (2.7 CM), SEE  COMMENT. - NO LYMPHOVASCULAR INVASION IDENTIFIED. - INVASIVE TUMOR IS 1 CM FROM THE NEAREST MARGIN (DEEP). - LOBULAR CARCINOMA IN SITU, GRADE 2-3 - DUCTAL CARCINOMA IN SITU, GRADE 2 - BENIGN SKIN, NEGATIVE FOR TUMOR. - MULTIPLE BENIGN SEBORRHEIC KERATOSES. - SEE TUMOR SYNOPTIC TEMPLATE. Microscopic Comment BREAST, INVASIVE TUMOR, WITH LYMPH NODE SAMPLING 1 of 3 FINAL for AUGUSTINA, BRADDOCK [phone]) Microscopic Comment(continued) Specimen, including laterality: Right breast. Procedure: Simple mastectomy. Grade: II of III. Tubule formation: 3. Nuclear pleomorphism: 3. Mitotic: 1. Tumor size (gross measurement): 2.7 cm. Margins: Invasive, distance to closest margin: 1 cm. In-situ, distance to closest margin: 1 cm (deep). If margin positive, focally or broadly: N/A. Lymphovascular invasion: Absent. Ductal carcinoma in situ: Present Grade: 2 of 3 Extensive intraductal component: Absent Lobular neoplasia: Present ( in situ carcinoma and atypical lobular hyperplasia ). Tumor focality: Unifocal. Treatment effect: None. If present,  treatment effect in breast tissue, lymph nodes or both: N/A. Extent of tumor: Skin: Negative for tumor. Nipple: Negative for tumor. Skeletal muscle: Negative for tumor. Lymph nodes: # examined: 0. Lymph nodes with metastasis: N/A. Breast prognostic profile: Estrogen receptor: Not repeated; previous study demonstrated 100% positivity ([id number]). Progesterone receptor: Not repeated; previous study demonstrated 100% positivity ([id number]). Her 2 neu: Repeated; previous study demonstrated no amplification (1.12) ([id number]). Ki-67: Not repeated; previous study demonstrated 31% proliferation rate ([id number]). Non-neoplastic breast: Fibroadenomatoid nodules, fibrocystic change, sclerosing adenosis, and previous biopsy site changes. TNM: pT2, pNX, pMX. Comments: None. (CRR:eps 05/19/12)  RADIOGRAPHIC STUDIES:  No results found.  ASSESSMENT: 79 year old female with  #1Stage II (2.7 cm ) invasive lobular carcinoma of the right breast status post mastectomy. ER positive PR positive HER-2/neu negative. Patient is now on Arimidex 1 mg daily tolerating it very well. The total of 5 years of therapy is planned. She has no evidence of recurrent disease.  PLAN:  #1 patient will continue Arimidex on a daily basis. Doing well.  No sign of recurrence.  I have ordered her mammogram for June 2014.    #2 We will see her back in 6 months.    All questions were answered. The patient knows to call the clinic with any problems, questions or concerns. We can certainly see the patient much sooner if necessary.  I spent 25 minutes counseling the patient face to face. The total time spent in the appointment was 30 minutes.  Cherie Ouch Lyn Hollingshead, NP Medical Oncology [facility] Phone: [phone] 01/09/2013, 11:19 AM

## 2013-02-16 ENCOUNTER — Encounter (HOSPITAL_COMMUNITY): Payer: Self-pay | Admitting: *Deleted

## 2013-02-16 ENCOUNTER — Encounter (HOSPITAL_COMMUNITY): Payer: Self-pay | Admitting: Pharmacy Technician

## 2013-02-16 ENCOUNTER — Other Ambulatory Visit (HOSPITAL_COMMUNITY): Payer: Self-pay | Admitting: Orthopedic Surgery

## 2013-02-16 MED ORDER — CLINDAMYCIN PHOSPHATE 900 MG/50ML IV SOLN
900.0000 mg | INTRAVENOUS | Status: AC
  Administered 2013-02-17: 900 mg via INTRAVENOUS
  Filled 2013-02-16: qty 50
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
## 2013-02-17 ENCOUNTER — Ambulatory Visit (HOSPITAL_COMMUNITY): Payer: Medicare Other

## 2013-02-17 ENCOUNTER — Encounter (HOSPITAL_COMMUNITY): Payer: Self-pay | Admitting: Surgery

## 2013-02-17 ENCOUNTER — Encounter (HOSPITAL_COMMUNITY)
Admission: RE | Disposition: A | Discharge status: Home / Self Care | Payer: Self-pay | Source: Ambulatory Visit | Attending: Orthopedic Surgery

## 2013-02-17 ENCOUNTER — Ambulatory Visit (HOSPITAL_COMMUNITY): Payer: Medicare Other | Admitting: Anesthesiology

## 2013-02-17 ENCOUNTER — Ambulatory Visit (HOSPITAL_COMMUNITY)
Admission: RE | Admit: 2013-02-17 | Discharge: 2013-02-17 | Disposition: A | Discharge status: Home / Self Care | Payer: Medicare Other | Source: Ambulatory Visit | Attending: Orthopedic Surgery | Admitting: Orthopedic Surgery

## 2013-02-17 ENCOUNTER — Encounter (HOSPITAL_COMMUNITY): Payer: Self-pay | Admitting: Anesthesiology

## 2013-02-17 DIAGNOSIS — Z888 Allergy status to other drugs, medicaments and biological substances status: Secondary | ICD-10-CM | POA: Insufficient documentation

## 2013-02-17 DIAGNOSIS — G5600 Carpal tunnel syndrome, unspecified upper limb: Secondary | ICD-10-CM | POA: Insufficient documentation

## 2013-02-17 DIAGNOSIS — E785 Hyperlipidemia, unspecified: Secondary | ICD-10-CM | POA: Insufficient documentation

## 2013-02-17 DIAGNOSIS — M129 Arthropathy, unspecified: Secondary | ICD-10-CM | POA: Insufficient documentation

## 2013-02-17 DIAGNOSIS — Z881 Allergy status to other antibiotic agents status: Secondary | ICD-10-CM | POA: Insufficient documentation

## 2013-02-17 DIAGNOSIS — Z880 Allergy status to penicillin: Secondary | ICD-10-CM | POA: Insufficient documentation

## 2013-02-17 DIAGNOSIS — I10 Essential (primary) hypertension: Secondary | ICD-10-CM | POA: Insufficient documentation

## 2013-02-17 DIAGNOSIS — Z853 Personal history of malignant neoplasm of breast: Secondary | ICD-10-CM | POA: Insufficient documentation

## 2013-02-17 DIAGNOSIS — G5601 Carpal tunnel syndrome, right upper limb: Secondary | ICD-10-CM

## 2013-02-17 HISTORY — PX: CARPAL TUNNEL RELEASE: SHX101

## 2013-02-17 HISTORY — DX: Personal history of other medical treatment: Z92.89

## 2013-02-17 HISTORY — DX: Headache: R51

## 2013-02-17 HISTORY — DX: Calculus in bladder: N21.0

## 2013-02-17 LAB — BASIC METABOLIC PANEL
BUN: 16 mg/dL (ref 6–23)
Calcium: 10.2 mg/dL (ref 8.4–10.5)
Creatinine, Ser: 0.99 mg/dL (ref 0.50–1.10)
GFR calc Af Amer: 60 mL/min — ABNORMAL LOW (ref >90)
GFR calc non Af Amer: 52 mL/min — ABNORMAL LOW (ref >90)
Glucose, Bld: 110 mg/dL — ABNORMAL HIGH (ref 70–99)
Potassium: 3.8 mEq/L (ref 3.5–5.1)

## 2013-02-17 LAB — SURGICAL PCR SCREEN: MRSA, PCR: NEGATIVE

## 2013-02-17 LAB — CBC
Hemoglobin: 13.7 g/dL (ref 12.0–15.0)
MCH: 29.7 pg (ref 26.0–34.0)
MCHC: 34.8 g/dL (ref 30.0–36.0)
Platelets: 240 10*3/uL (ref 150–400)
RDW: 13 % (ref 11.5–15.5)

## 2013-02-17 SURGERY — CARPAL TUNNEL RELEASE
Anesthesia: General | Site: Hand | Laterality: Right | Wound class: Clean

## 2013-02-17 MED ORDER — PROPOFOL 10 MG/ML IV BOLUS
INTRAVENOUS | Status: DC | PRN
  Administered 2013-02-17: 140 mg via INTRAVENOUS

## 2013-02-17 MED ORDER — LACTATED RINGERS IV SOLN
INTRAVENOUS | Status: DC
  Administered 2013-02-17: 10:00:00 via INTRAVENOUS

## 2013-02-17 MED ORDER — LIDOCAINE HCL (CARDIAC) 20 MG/ML IV SOLN
INTRAVENOUS | Status: DC | PRN
  Administered 2013-02-17: 100 mg via INTRAVENOUS

## 2013-02-17 MED ORDER — FENTANYL CITRATE 0.05 MG/ML IJ SOLN
INTRAMUSCULAR | Status: DC | PRN
  Administered 2013-02-17 (×2): 50 ug via INTRAVENOUS

## 2013-02-17 MED ORDER — OXYCODONE HCL 5 MG PO TABS
5.0000 mg | ORAL_TABLET | Freq: Once | ORAL | Status: DC | PRN
Start: 2013-02-17 — End: 2013-02-17

## 2013-02-17 MED ORDER — MEPERIDINE HCL 25 MG/ML IJ SOLN: 6.2500 mg | INTRAMUSCULAR | Status: DC | PRN

## 2013-02-17 MED ORDER — HYDROMORPHONE HCL PF 1 MG/ML IJ SOLN: 0.2500 mg | INTRAMUSCULAR | Status: DC | PRN

## 2013-02-17 MED ORDER — CLINDAMYCIN PHOSPHATE 900 MG/50ML IV SOLN
900.0000 mg | INTRAVENOUS | Status: DC
  Filled 2013-02-17 (×2): qty 50

## 2013-02-17 MED ORDER — BUPIVACAINE HCL (PF) 0.5 % IJ SOLN
INTRAMUSCULAR | Status: AC
  Filled 2013-02-17: qty 30

## 2013-02-17 MED ORDER — 0.9 % SODIUM CHLORIDE (POUR BTL) OPTIME
TOPICAL | Status: DC | PRN
  Administered 2013-02-17: 1000 mL

## 2013-02-17 MED ORDER — MUPIROCIN 2 % EX OINT
TOPICAL_OINTMENT | Freq: Two times a day (BID) | CUTANEOUS | Status: DC
  Administered 2013-02-17: 1 via NASAL
  Filled 2013-02-17: qty 22

## 2013-02-17 MED ORDER — LACTATED RINGERS IV SOLN
INTRAVENOUS | Status: DC | PRN
  Administered 2013-02-17: 11:00:00 via INTRAVENOUS

## 2013-02-17 MED ORDER — ONDANSETRON HCL 4 MG/2ML IJ SOLN: 4.0000 mg | Freq: Once | INTRAMUSCULAR | Status: DC | PRN

## 2013-02-17 MED ORDER — ONDANSETRON HCL 4 MG/2ML IJ SOLN
INTRAMUSCULAR | Status: DC | PRN
  Administered 2013-02-17: 4 mg via INTRAVENOUS

## 2013-02-17 MED ORDER — OXYCODONE HCL 5 MG/5ML PO SOLN: 5.0000 mg | Freq: Once | ORAL | Status: DC | PRN

## 2013-02-17 MED ORDER — BUPIVACAINE HCL 0.5 % IJ SOLN
INTRAMUSCULAR | Status: DC | PRN
  Administered 2013-02-17: 10 mL

## 2013-02-17 SURGICAL SUPPLY — 47 items
BANDAGE ELASTIC 3 VELCRO ST LF (GAUZE/BANDAGES/DRESSINGS) ×2 IMPLANT
BANDAGE ELASTIC 4 VELCRO ST LF (GAUZE/BANDAGES/DRESSINGS) ×2 IMPLANT
BANDAGE GAUZE ELAST BULKY 4 IN (GAUZE/BANDAGES/DRESSINGS) ×2 IMPLANT
BNDG ESMARK 4X9 LF (GAUZE/BANDAGES/DRESSINGS) ×2 IMPLANT
CLOTH BEACON ORANGE TIMEOUT ST (SAFETY) ×2 IMPLANT
CORDS BIPOLAR (ELECTRODE) ×2 IMPLANT
COVER SURGICAL LIGHT HANDLE (MISCELLANEOUS) ×2 IMPLANT
CUFF TOURNIQUET SINGLE 18IN (TOURNIQUET CUFF) ×2 IMPLANT
CUFF TOURNIQUET SINGLE 24IN (TOURNIQUET CUFF) IMPLANT
DRAPE SURG 17X23 STRL (DRAPES) ×2 IMPLANT
DURAPREP 26ML APPLICATOR (WOUND CARE) ×2 IMPLANT
GAUZE XEROFORM 1X8 LF (GAUZE/BANDAGES/DRESSINGS) ×2 IMPLANT
GLOVE BIO SURGEON STRL SZ7 (GLOVE) ×2 IMPLANT
GLOVE BIOGEL PI IND STRL 7.0 (GLOVE) ×1 IMPLANT
GLOVE BIOGEL PI IND STRL 7.5 (GLOVE) ×1 IMPLANT
GLOVE BIOGEL PI IND STRL 8 (GLOVE) ×1 IMPLANT
GLOVE BIOGEL PI INDICATOR 7.0 (GLOVE) ×1
GLOVE BIOGEL PI INDICATOR 7.5 (GLOVE) ×1
GLOVE BIOGEL PI INDICATOR 8 (GLOVE) ×1
GLOVE ECLIPSE 7.0 STRL STRAW (GLOVE) ×2 IMPLANT
GLOVE SURG ORTHO 8.0 STRL STRW (GLOVE) ×2 IMPLANT
GLOVE SURG SS PI 7.0 STRL IVOR (GLOVE) ×2 IMPLANT
GOWN PREVENTION PLUS LG XLONG (DISPOSABLE) IMPLANT
GOWN PREVENTION PLUS XLARGE (GOWN DISPOSABLE) ×2 IMPLANT
GOWN STRL NON-REIN LRG LVL3 (GOWN DISPOSABLE) ×4 IMPLANT
KIT BASIN OR (CUSTOM PROCEDURE TRAY) ×2 IMPLANT
KIT ROOM TURNOVER OR (KITS) ×2 IMPLANT
LOOP VESSEL MAXI BLUE (MISCELLANEOUS) IMPLANT
NEEDLE HYPO 25GX1X1/2 BEV (NEEDLE) ×2 IMPLANT
NS IRRIG 1000ML POUR BTL (IV SOLUTION) ×2 IMPLANT
PACK ORTHO EXTREMITY (CUSTOM PROCEDURE TRAY) ×2 IMPLANT
PAD ARMBOARD 7.5X6 YLW CONV (MISCELLANEOUS) ×2 IMPLANT
PAD CAST 4YDX4 CTTN HI CHSV (CAST SUPPLIES) ×1 IMPLANT
PADDING CAST COTTON 4X4 STRL (CAST SUPPLIES) ×1
SPONGE GAUZE 4X4 12PLY (GAUZE/BANDAGES/DRESSINGS) ×2 IMPLANT
SUCTION FRAZIER TIP 10 FR DISP (SUCTIONS) ×2 IMPLANT
SUT ETHILON 3 0 PS 1 (SUTURE) ×2 IMPLANT
SUT VIC AB 2-0 CT1 27 (SUTURE) ×1
SUT VIC AB 2-0 CT1 TAPERPNT 27 (SUTURE) ×1 IMPLANT
SUT VIC AB 3-0 FS2 27 (SUTURE) ×2 IMPLANT
SYR CONTROL 10ML LL (SYRINGE) ×2 IMPLANT
SYSTEM CHEST DRAIN TLS 7FR (DRAIN) IMPLANT
TOWEL OR 17X24 6PK STRL BLUE (TOWEL DISPOSABLE) ×2 IMPLANT
TOWEL OR 17X26 10 PK STRL BLUE (TOWEL DISPOSABLE) ×2 IMPLANT
TUBE CONNECTING 12X1/4 (SUCTIONS) ×2 IMPLANT
UNDERPAD 30X30 INCONTINENT (UNDERPADS AND DIAPERS) ×2 IMPLANT
WATER STERILE IRR 1000ML POUR (IV SOLUTION) IMPLANT

## 2013-02-17 NOTE — Brief Op Note (Signed)
02/17/2013  11:45 AM  PATIENT:  Jean [name]  79 y.o. female  PRE-OPERATIVE DIAGNOSIS:  Right carpal tunnel syndrome  POST-OPERATIVE DIAGNOSIS:  Right carpal tunnel syndrome  PROCEDURE:  Procedure(s): RIGHT CARPAL TUNNEL RELEASE  SURGEON:  Surgeon(s): Cammy Copa, MD  ASSISTANT:   ANESTHESIA:   general  EBL: 1 ml    Total I/O In: 600 [I.V.:600] Out: -   BLOOD ADMINISTERED: none  DRAINS: none   LOCAL MEDICATIONS USED:  none  SPECIMEN:  No Specimen  COUNTS:  YES  TOURNIQUET:   Total Tourniquet Time Documented: Upper Arm (Right) - 10 minutes Total: Upper Arm (Right) - 10 minutes   DICTATION: .Other Dictation: Dictation Number [id number]  PLAN OF CARE: Discharge to home after PACU  PATIENT DISPOSITION:  PACU - hemodynamically stable

## 2013-02-17 NOTE — Anesthesia Preprocedure Evaluation (Signed)
Anesthesia Evaluation  Patient identified by MRN, date of birth, ID band Patient awake    Reviewed: Allergy & Precautions, H&P , NPO status , Patient's Chart, lab work & pertinent test results  Airway Mallampati: I TM Distance: >3 FB Neck ROM: Full    Dental   Pulmonary          Cardiovascular hypertension, Pt. on medications     Neuro/Psych    GI/Hepatic   Endo/Other    Renal/GU      Musculoskeletal   Abdominal   Peds  Hematology   Anesthesia Other Findings   Reproductive/Obstetrics                           Anesthesia Physical Anesthesia Plan  ASA: II  Anesthesia Plan: General   Post-op Pain Management:    Induction: Intravenous  Airway Management Planned: LMA  Additional Equipment:   Intra-op Plan:   Post-operative Plan: Extubation in OR  Informed Consent: I have reviewed the patients History and Physical, chart, labs and discussed the procedure including the risks, benefits and alternatives for the proposed anesthesia with the patient or authorized representative who has indicated his/her understanding and acceptance.     Plan Discussed with: CRNA and Surgeon  Anesthesia Plan Comments:         Anesthesia Quick Evaluation  

## 2013-02-17 NOTE — Transfer of Care (Signed)
Immediate Anesthesia Transfer of Care Note  Patient: Jean [name]  Procedure(s) Performed: Procedure(s): RIGHT CARPAL TUNNEL RELEASE (Right)  Patient Location: PACU  Anesthesia Type:General  Level of Consciousness: awake, alert , oriented and sedated  Airway & Oxygen Therapy: Patient Spontanous Breathing and Patient connected to nasal cannula oxygen  Post-op Assessment: Report given to PACU RN, Post -op Vital signs reviewed and stable and Patient moving all extremities  Post vital signs: Reviewed and stable  Complications: No apparent anesthesia complications

## 2013-02-17 NOTE — Interval H&P Note (Signed)
History and Physical Interval Note:  02/17/2013 7:14 AM  Jean [name]  has presented today for surgery, with the diagnosis of Right carpal tunnel syndrome  The various methods of treatment have been discussed with the patient and family. After consideration of risks, benefits and other options for treatment, the patient has consented to  Procedure(s): RIGHT CARPAL TUNNEL RELEASE (Right) as a surgical intervention .  The patient's history has been reviewed, patient examined, no change in status, stable for surgery.  I have reviewed the patient's chart and labs.  Questions were answered to the patient's satisfaction.     DEAN,GREGORY SCOTT

## 2013-02-17 NOTE — Anesthesia Postprocedure Evaluation (Signed)
Anesthesia Post Note  Patient: Jean [name]  Procedure(s) Performed: Procedure(s) (LRB): RIGHT CARPAL TUNNEL RELEASE (Right)  Anesthesia type: general  Patient location: PACU  Post pain: Pain level controlled  Post assessment: Patient's Cardiovascular Status Stable  Last Vitals:  Filed Vitals:   02/17/13 1230  BP:   Pulse: 60  Temp:   Resp: 14    Post vital signs: Reviewed and stable  Level of consciousness: sedated  Complications: No apparent anesthesia complications

## 2013-02-17 NOTE — Anesthesia Procedure Notes (Signed)
Procedure Name: LMA Insertion Date/Time: 02/17/2013 11:04 AM Performed by: Fransisca Kaufmann Pre-anesthesia Checklist: Patient identified, Emergency Drugs available, Suction available, Patient being monitored and Timeout performed Patient Re-evaluated:Patient Re-evaluated prior to inductionOxygen Delivery Method: Circle system utilized Preoxygenation: Pre-oxygenation with 100% oxygen Intubation Type: IV induction Ventilation: Mask ventilation without difficulty LMA Size: 4.0 Number of attempts: 1 Placement Confirmation: positive ETCO2 and breath sounds checked- equal and bilateral Tube secured with: Tape Dental Injury: Teeth and Oropharynx as per pre-operative assessment

## 2013-02-17 NOTE — Preoperative (Signed)
Beta Blockers   Reason not to administer Beta Blockers:Not Applicable 

## 2013-02-17 NOTE — Interval H&P Note (Signed)
History and Physical Interval Note:  02/17/2013 7:14 AM  Jean [name]  has presented today for surgery, with the diagnosis of Right carpal tunnel syndrome  The various methods of treatment have been discussed with the patient and family. After consideration of risks, benefits and other options for treatment, the patient has consented to  Procedure(s): RIGHT CARPAL TUNNEL RELEASE (Right) as a surgical intervention .  The patient's history has been reviewed, patient examined, no change in status, stable for surgery.  I have reviewed the patient's chart and labs.  Questions were answered to the patient's satisfaction.     DEAN,GREGORY SCOTT   

## 2013-02-18 ENCOUNTER — Encounter (HOSPITAL_COMMUNITY): Payer: Self-pay | Admitting: Orthopedic Surgery

## 2013-02-18 NOTE — Op Note (Signed)
NAMECHANNING, SAVICH NO.:  [id number]  MEDICAL RECORD NO.:  [id number]  LOCATION:  [facility]                         FACILITY:  [facility]  PHYSICIAN:  Burnard Bunting, M.D.    DATE OF BIRTH:  Sep 08, 1929  DATE OF PROCEDURE: DATE OF DISCHARGE:  02/17/2013                              OPERATIVE REPORT   PREOPERATIVE DIAGNOSIS:  Right carpal tunnel syndrome.  POSTOPERATIVE DIAGNOSIS:  Right carpal tunnel syndrome.  PROCEDURE:  Right carpal tunnel release.  SURGEON:  Burnard Bunting, M.D.  ASSISTANT:  None.  ANESTHESIA:  General.  INDICATIONS:  Jean [name] is a patient with right carpal syndrome, presents for operative management after explanation of risks and benefits.  PROCEDURE IN DETAIL:  The patient was brought to the operating room where general endotracheal anesthesia was achieved.  Preop antibiotics were administered.  Time-out was called.  The right hand was prescrubbed with alcohol and Betadine, which was allowed to dry, prepped with DuraPrep solution, draped in a sterile manner.  Arm was elevated, exsanguinated with Esmarch wrap.  Tourniquet was inflated.  Incision was made at the intersection of the radial border of the fourth finger and Kaplan's cardinal line, taken to the proximal wrist flexion crease. Skin and subcu tissue were sharply divided, and palmar fascia divided. The transverse carpal ligament was visualized.  Transverse carpal ligament was divided in its midsection, 2-3 mm.  Right angle retractor was then placed between the median nerve and the transverse carpal ligament, which was then divided out distally under direct visualization and proximally to the forearm fascia also under direct visualization. Motor branch intact.  Thorough irrigation performed, no other masses or cysts noted in the carpal tunnel.  Thorough irrigation was then performed.  Skin edges were anesthetized with Marcaine.  The tourniquet was released.  Bleeding points were  counted and controlled with electrocautery.  The skin edges were then reapproximated using 3-0 nylon simple sutures.  Splint applied.  Total tourniquet time 10 minutes.  The patient tolerated the procedure well without immediate complication.     Burnard Bunting, M.D.    GSD/MEDQ  D:  02/17/2013  T:  02/18/2013  Job:  [id number]

## 2013-05-11 ENCOUNTER — Ambulatory Visit
Admission: RE | Admit: 2013-05-11 | Discharge: 2013-05-11 | Disposition: A | Discharge status: Home / Self Care | Payer: Medicare Other | Source: Ambulatory Visit | Attending: Adult Health | Admitting: Adult Health

## 2013-05-11 ENCOUNTER — Other Ambulatory Visit: Payer: Self-pay | Admitting: Adult Health

## 2013-05-11 DIAGNOSIS — Z853 Personal history of malignant neoplasm of breast: Secondary | ICD-10-CM

## 2013-07-20 ENCOUNTER — Ambulatory Visit: Payer: Medicare Other | Admitting: Oncology

## 2013-07-20 ENCOUNTER — Other Ambulatory Visit: Payer: Medicare Other | Admitting: Lab

## 2013-07-22 ENCOUNTER — Telehealth: Payer: Self-pay | Admitting: Oncology

## 2013-07-22 NOTE — Telephone Encounter (Signed)
, °

## 2013-09-21 ENCOUNTER — Telehealth: Payer: Self-pay | Admitting: Oncology

## 2013-10-14 ENCOUNTER — Telehealth: Payer: Self-pay | Admitting: Oncology

## 2013-10-27 ENCOUNTER — Other Ambulatory Visit: Payer: Self-pay | Admitting: Orthopaedic Surgery

## 2013-10-27 DIAGNOSIS — M545 Low back pain: Secondary | ICD-10-CM

## 2013-10-30 ENCOUNTER — Ambulatory Visit: Payer: Medicare Other | Admitting: Oncology

## 2013-10-30 ENCOUNTER — Other Ambulatory Visit: Payer: Medicare Other | Admitting: Lab

## 2013-10-31 ENCOUNTER — Ambulatory Visit
Admission: RE | Admit: 2013-10-31 | Discharge: 2013-10-31 | Disposition: A | Discharge status: Home / Self Care | Payer: Medicare Other | Source: Ambulatory Visit | Attending: Orthopaedic Surgery | Admitting: Orthopaedic Surgery

## 2013-10-31 DIAGNOSIS — M545 Low back pain: Secondary | ICD-10-CM

## 2013-11-17 ENCOUNTER — Ambulatory Visit (INDEPENDENT_AMBULATORY_CARE_PROVIDER_SITE_OTHER): Payer: Medicare Other | Admitting: Surgery

## 2013-11-17 VITALS — BP 128/76 | HR 60 | Temp 97.6°F | Resp 16 | Ht 64.0 in | Wt 146.0 lb

## 2013-11-17 DIAGNOSIS — Z853 Personal history of malignant neoplasm of breast: Secondary | ICD-10-CM

## 2013-11-17 NOTE — Progress Notes (Signed)
Subjective:     Patient ID: Jean [name], female   DOB: 1929-03-09, 80 y.o.   MRN: [id number]  HPI She is here for long-term followup of her right mastectomy for invasive lobular carcinoma. She has no complaints regarding her breast in mild discomfort on the right side  Review of Systems     Objective:   Physical Exam On exam, the mastectomy site is well healed. There are no palpable masses a left breast for right chest. There is no axillary adenopathy on either side.    Assessment:     Patient stable with a history of right breast cancer     Plan:      I will see her back in one year. She will continue her current care

## 2013-12-18 ENCOUNTER — Ambulatory Visit (HOSPITAL_BASED_OUTPATIENT_CLINIC_OR_DEPARTMENT_OTHER): Payer: Medicare Other | Admitting: Oncology

## 2013-12-18 ENCOUNTER — Ambulatory Visit (HOSPITAL_BASED_OUTPATIENT_CLINIC_OR_DEPARTMENT_OTHER): Payer: Medicare Other

## 2013-12-18 ENCOUNTER — Telehealth: Payer: Self-pay | Admitting: Oncology

## 2013-12-18 ENCOUNTER — Other Ambulatory Visit: Payer: Self-pay | Admitting: Oncology

## 2013-12-18 VITALS — BP 152/69 | HR 71 | Temp 99.1°F | Resp 18 | Ht 64.0 in | Wt 152.3 lb

## 2013-12-18 DIAGNOSIS — Z9011 Acquired absence of right breast and nipple: Secondary | ICD-10-CM

## 2013-12-18 DIAGNOSIS — D649 Anemia, unspecified: Secondary | ICD-10-CM

## 2013-12-18 DIAGNOSIS — C50419 Malignant neoplasm of upper-outer quadrant of unspecified female breast: Secondary | ICD-10-CM

## 2013-12-18 DIAGNOSIS — Z1231 Encounter for screening mammogram for malignant neoplasm of breast: Secondary | ICD-10-CM

## 2013-12-18 DIAGNOSIS — E559 Vitamin D deficiency, unspecified: Secondary | ICD-10-CM

## 2013-12-18 DIAGNOSIS — C50911 Malignant neoplasm of unspecified site of right female breast: Secondary | ICD-10-CM

## 2013-12-18 DIAGNOSIS — M8580 Other specified disorders of bone density and structure, unspecified site: Secondary | ICD-10-CM

## 2013-12-18 DIAGNOSIS — Z170 Estrogen receptor positive status [ER+]: Secondary | ICD-10-CM

## 2013-12-18 DIAGNOSIS — M899 Disorder of bone, unspecified: Secondary | ICD-10-CM

## 2013-12-18 DIAGNOSIS — M949 Disorder of cartilage, unspecified: Secondary | ICD-10-CM

## 2013-12-18 LAB — CBC WITH DIFFERENTIAL/PLATELET
BASO%: 1 % (ref 0.0–2.0)
BASOS ABS: 0.1 10*3/uL (ref 0.0–0.1)
EOS ABS: 0.1 10*3/uL (ref 0.0–0.5)
EOS%: 1.7 % (ref 0.0–7.0)
HCT: 34.2 % — ABNORMAL LOW (ref 34.8–46.6)
HEMOGLOBIN: 11.4 g/dL — AB (ref 11.6–15.9)
LYMPH%: 25.1 % (ref 14.0–49.7)
MCH: 29.2 pg (ref 25.1–34.0)
MCHC: 33.4 g/dL (ref 31.5–36.0)
MCV: 87.5 fL (ref 79.5–101.0)
MONO#: 0.7 10*3/uL (ref 0.1–0.9)
MONO%: 7.9 % (ref 0.0–14.0)
NEUT%: 64.3 % (ref 38.4–76.8)
NEUTROS ABS: 5.5 10*3/uL (ref 1.5–6.5)
PLATELETS: 253 10*3/uL (ref 145–400)
RBC: 3.91 10*6/uL (ref 3.70–5.45)
RDW: 14.5 % (ref 11.2–14.5)
WBC: 8.5 10*3/uL (ref 3.9–10.3)
lymph#: 2.1 10*3/uL (ref 0.9–3.3)

## 2013-12-18 LAB — COMPREHENSIVE METABOLIC PANEL (CC13)
ALBUMIN: 4.2 g/dL (ref 3.5–5.0)
ALK PHOS: 83 U/L (ref 40–150)
ALT: 16 U/L (ref 0–55)
ANION GAP: 10 meq/L (ref 3–11)
AST: 25 U/L (ref 5–34)
BILIRUBIN TOTAL: 0.45 mg/dL (ref 0.20–1.20)
BUN: 19.4 mg/dL (ref 7.0–26.0)
CO2: 28 meq/L (ref 22–29)
Calcium: 10.1 mg/dL (ref 8.4–10.4)
Chloride: 103 mEq/L (ref 98–109)
Creatinine: 1.6 mg/dL — ABNORMAL HIGH (ref 0.6–1.1)
GLUCOSE: 105 mg/dL (ref 70–140)
POTASSIUM: 3.6 meq/L (ref 3.5–5.1)
SODIUM: 140 meq/L (ref 136–145)
TOTAL PROTEIN: 7.2 g/dL (ref 6.4–8.3)

## 2013-12-18 NOTE — Patient Instructions (Signed)
Bone Densitometry Bone densitometry is a special X-ray that measures your bone density and can be used to help predict your risk of bone fractures. This test is used to determine bone mineral content and density to diagnose osteoporosis. Osteoporosis is the loss of bone that may cause the bone to become weak. Osteoporosis commonly occurs in women entering menopause. However, it may be found in men and in people with other diseases. PREPARATION FOR TEST No preparation necessary. WHO SHOULD BE TESTED?  All women older than 65.  Postmenopausal women (50 to 65) with risk factors for osteoporosis.  People with a previous fracture caused by normal activities.  People with a small body frame (less than 127 poundsor a body mass index [BMI] of less than 21).  People who have a parent with a hip fracture or history of osteoporosis.  People who smoke.  People who have rheumatoid arthritis.  Anyone who engages in excessive alcohol use (more than 3 drinks most days).  Women who experience early menopause. WHEN SHOULD YOU BE RETESTED? Current guidelines suggest that you should wait at least 2 years before doing a bone density test again if your first test was normal.Recent studies indicated that women with normal bone density may be able to wait a few years before needing to repeat a bone density test. You should discuss this with your caregiver.  NORMAL FINDINGS   Normal: less than standard deviation below normal (greater than -1).  Osteopenia: 1 to 2.5 standard deviations below normal (-1 to -2.5).  Osteoporosis: greater than 2.5 standard deviations below normal (less than -2.5). Test results are reported as a "T score" and a "Z score."The T score is a number that compares your bone density with the bone density of healthy, young women.The Z score is a number that compares your bone density with the scores of women who are the same age, gender, and race.  Ranges for normal findings may vary  among different laboratories and hospitals. You should always check with your doctor after having lab work or other tests done to discuss the meaning of your test results and whether your values are considered within normal limits. MEANING OF TEST  Your caregiver will go over the test results with you and discuss the importance and meaning of your results, as well as treatment options and the need for additional tests if necessary. OBTAINING THE TEST RESULTS It is your responsibility to obtain your test results. Ask the lab or department performing the test when and how you will get your results. Document Released: 11/20/2004 Document Revised: 01/21/2012 Document Reviewed: 12/13/2010 [employer] Patient Information 2014 [employer].  

## 2013-12-18 NOTE — Telephone Encounter (Signed)
, °

## 2013-12-22 ENCOUNTER — Encounter: Payer: Self-pay | Admitting: Oncology
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
## 2013-12-23 ENCOUNTER — Telehealth: Payer: Self-pay | Admitting: Oncology

## 2013-12-23 NOTE — Telephone Encounter (Signed)
, °

## 2014-05-12 ENCOUNTER — Ambulatory Visit
Admission: RE | Admit: 2014-05-12 | Discharge: 2014-05-12 | Disposition: A | Discharge status: Home / Self Care | Payer: Medicare Other | Source: Ambulatory Visit | Attending: Oncology | Admitting: Oncology

## 2014-05-12 DIAGNOSIS — M8580 Other specified disorders of bone density and structure, unspecified site: Secondary | ICD-10-CM

## 2014-05-12 DIAGNOSIS — Z1231 Encounter for screening mammogram for malignant neoplasm of breast: Secondary | ICD-10-CM

## 2014-05-12 DIAGNOSIS — C50911 Malignant neoplasm of unspecified site of right female breast: Secondary | ICD-10-CM

## 2014-05-12 DIAGNOSIS — Z9011 Acquired absence of right breast and nipple: Secondary | ICD-10-CM

## 2014-05-13 ENCOUNTER — Telehealth: Payer: Self-pay | Admitting: *Deleted

## 2014-05-13 NOTE — Telephone Encounter (Signed)
Message copied by Harmon Pier on Thu May 13, 2014  5:26 PM ------      Message from: Minette Headland      Created: Thu May 13, 2014  3:20 PM       Please call patient and give her normal results.  Thanks ,      [place]      ----- Message -----         From: Rad Results In Interface         Sent: 05/13/2014   2:55 PM           To: Deatra Robinson, MD                   ------

## 2014-05-13 NOTE — Telephone Encounter (Signed)
Called pt to inform her of bone density results. Communicated with pt that test results were normal. Pt verbalized understanding. Message to be forwarded to [place].

## 2014-05-26 ENCOUNTER — Telehealth: Payer: Self-pay

## 2014-05-26 NOTE — Telephone Encounter (Signed)
Received by mail dtd 05/12/14 from [facility] bone density report.  Original to scan.  Copy to [place].

## 2014-06-03 ENCOUNTER — Telehealth: Payer: Self-pay | Admitting: *Deleted

## 2014-06-03 ENCOUNTER — Other Ambulatory Visit: Payer: Self-pay

## 2014-06-03 DIAGNOSIS — C50911 Malignant neoplasm of unspecified site of right female breast: Secondary | ICD-10-CM

## 2014-06-03 NOTE — Telephone Encounter (Signed)
Notified pt of future appointment on 06/24/14. Pt agreed with appt time and date

## 2014-06-04 ENCOUNTER — Other Ambulatory Visit (HOSPITAL_BASED_OUTPATIENT_CLINIC_OR_DEPARTMENT_OTHER): Payer: Medicare Other

## 2014-06-04 ENCOUNTER — Other Ambulatory Visit: Payer: Self-pay | Admitting: *Deleted

## 2014-06-04 DIAGNOSIS — Z79811 Long term (current) use of aromatase inhibitors: Secondary | ICD-10-CM

## 2014-06-04 DIAGNOSIS — C50911 Malignant neoplasm of unspecified site of right female breast: Secondary | ICD-10-CM

## 2014-06-04 DIAGNOSIS — D649 Anemia, unspecified: Secondary | ICD-10-CM

## 2014-06-04 DIAGNOSIS — C50419 Malignant neoplasm of upper-outer quadrant of unspecified female breast: Secondary | ICD-10-CM

## 2014-06-04 LAB — CBC WITH DIFFERENTIAL/PLATELET
BASO%: 0.9 % (ref 0.0–2.0)
Basophils Absolute: 0.1 10*3/uL (ref 0.0–0.1)
EOS ABS: 0.3 10*3/uL (ref 0.0–0.5)
EOS%: 3.8 % (ref 0.0–7.0)
HEMATOCRIT: 38.3 % (ref 34.8–46.6)
HGB: 12.6 g/dL (ref 11.6–15.9)
LYMPH%: 25.3 % (ref 14.0–49.7)
MCH: 29.3 pg (ref 25.1–34.0)
MCHC: 32.9 g/dL (ref 31.5–36.0)
MCV: 89 fL (ref 79.5–101.0)
MONO#: 0.5 10*3/uL (ref 0.1–0.9)
MONO%: 7.2 % (ref 0.0–14.0)
NEUT#: 4.6 10*3/uL (ref 1.5–6.5)
NEUT%: 62.8 % (ref 38.4–76.8)
Platelets: 296 10*3/uL (ref 145–400)
RBC: 4.3 10*6/uL (ref 3.70–5.45)
RDW: 13.3 % (ref 11.2–14.5)
WBC: 7.4 10*3/uL (ref 3.9–10.3)
lymph#: 1.9 10*3/uL (ref 0.9–3.3)

## 2014-06-04 LAB — COMPREHENSIVE METABOLIC PANEL (CC13)
ALT: 13 U/L (ref 0–55)
ANION GAP: 11 meq/L (ref 3–11)
AST: 18 U/L (ref 5–34)
Albumin: 3.9 g/dL (ref 3.5–5.0)
Alkaline Phosphatase: 88 U/L (ref 40–150)
BILIRUBIN TOTAL: 0.35 mg/dL (ref 0.20–1.20)
BUN: 20.1 mg/dL (ref 7.0–26.0)
CO2: 24 meq/L (ref 22–29)
Calcium: 9.7 mg/dL (ref 8.4–10.4)
Chloride: 106 mEq/L (ref 98–109)
Creatinine: 1.6 mg/dL — ABNORMAL HIGH (ref 0.6–1.1)
GLUCOSE: 95 mg/dL (ref 70–140)
Potassium: 3.5 mEq/L (ref 3.5–5.1)
Sodium: 141 mEq/L (ref 136–145)
Total Protein: 7.4 g/dL (ref 6.4–8.3)

## 2014-06-11 ENCOUNTER — Ambulatory Visit: Payer: Medicare Other | Admitting: Oncology

## 2014-06-11 ENCOUNTER — Other Ambulatory Visit: Payer: Medicare Other

## 2014-06-24 ENCOUNTER — Ambulatory Visit (HOSPITAL_BASED_OUTPATIENT_CLINIC_OR_DEPARTMENT_OTHER): Payer: Medicare Other | Admitting: Hematology and Oncology

## 2014-06-24 ENCOUNTER — Encounter: Payer: Self-pay | Admitting: Hematology and Oncology

## 2014-06-24 VITALS — BP 165/85 | HR 62 | Temp 98.2°F | Resp 20 | Ht 64.0 in | Wt 164.4 lb

## 2014-06-24 DIAGNOSIS — C50911 Malignant neoplasm of unspecified site of right female breast: Secondary | ICD-10-CM

## 2014-06-24 DIAGNOSIS — C50419 Malignant neoplasm of upper-outer quadrant of unspecified female breast: Secondary | ICD-10-CM

## 2014-06-24 NOTE — Progress Notes (Signed)
Patient Care Team: Sheral Apley, MD as PCP - General (Family Medicine)  DIAGNOSIS: Breast cancer, right   Primary site: Breast   Staging method: AJCC 7th Edition   Clinical: (T1c, N0, cM0)   Summary: (T1c, N0, cM0)   SUMMARY OF ONCOLOGIC HISTORY:   Breast cancer, right   04/30/2012 Initial Diagnosis Breast cancer, right  invasive lobular carcinoma. 2.7 cm ER positive PR positive HER-2/neu negative invasive lobular carcinoma. Sentinel node biopsy was not performed.   06/10/2012 -  Anti-estrogen oral therapy Arimidex 1 mg daily starting 06/10/2012    CHIEF COMPLIANT:  Routine 6 month followup for her history of breast cancer INTERVAL HISTORY:  Jean [name] is an 80 year old Caucasian lady with the above-mentioned history of right-sided invasive lobular carcinoma. She is currently on antiestrogen therapy with Arimidex and tolerating it very well without any major problems she had recent mammogram and bone density insipidus vessels results overall she has no new complaints or concerns. Last August she had a major problem with a kidney stone that led to hospitalization and spending 4 weeks in the rehabilitation center she has not had any such issues this year. Since her husband passed away she has been spending most of the time with her friends. She continues to walk daily.  REVIEW OF SYSTEMS:   Constitutional: Denies fevers, chills or abnormal weight loss Eyes: Denies blurriness of vision Ears, nose, mouth, throat, and face: Denies mucositis or sore throat Respiratory: Denies cough, dyspnea or wheezes Cardiovascular: Denies palpitation, chest discomfort or lower extremity swelling Gastrointestinal:  Denies nausea, heartburn or change in bowel habits Skin: Denies abnormal skin rashes Lymphatics: Denies new lymphadenopathy or easy bruising Neurological:Denies numbness, tingling or new weaknesses Behavioral/Psych: Mood is stable, no new changes  All other systems were reviewed with the patient  and are negative.  I have reviewed the past medical history, past surgical history, social history and family history with the patient and they are unchanged from previous note.  ALLERGIES:  is allergic to cephalexin; hydromorphone hcl; penicillins; avelox; pentazocine; and sulfa antibiotics.  MEDICATIONS:  Current Outpatient Prescriptions  Medication Sig Dispense Refill  . AMLODIPINE BESYLATE PO Take 5 mg by mouth daily.      Marland Kitchen amLODipine-valsartan (EXFORGE) 10-320 MG per tablet Take 1 tablet by mouth daily.      Marland Kitchen anastrozole (ARIMIDEX) 1 MG tablet Take 1 mg by mouth daily.      . bumetanide (BUMEX) 1 MG tablet Take 1 mg by mouth daily.      . cyanocobalamin (,VITAMIN B-12,) 1000 MCG/ML injection Inject 1,000 mcg into the muscle every 30 (thirty) days.      . ferrous sulfate 325 (65 FE) MG tablet Take 325 mg by mouth daily with breakfast.      . hydrALAZINE (APRESOLINE) 25 MG tablet Take 25 mg by mouth 3 (three) times daily.       . meloxicam (MOBIC) 15 MG tablet Take 15 mg by mouth daily.      . nadolol (CORGARD) 40 MG tablet Take 20 mg by mouth daily.       Marland Kitchen PARoxetine (PAXIL) 20 MG tablet Take 20 mg by mouth daily.      . potassium chloride (K-DUR) 10 MEQ tablet Take 20 mEq by mouth daily.      . pravastatin (PRAVACHOL) 40 MG tablet Take 40 mg by mouth daily.      . temazepam (RESTORIL) 15 MG capsule Take 15 mg by mouth at bedtime as needed for sleep.       Marland Kitchen  mirtazapine (REMERON) 15 MG tablet Take 15 mg by mouth at bedtime.       No current facility-administered medications for this visit.    PHYSICAL EXAMINATION: ECOG PERFORMANCE STATUS: 0 - Asymptomatic  Filed Vitals:   06/24/14 1550  BP: 165/85  Pulse: 62  Temp: 98.2 F (36.8 C)  Resp: 20   Filed Weights   06/24/14 1550  Weight: 164 lb 6.4 oz (74.571 kg)    GENERAL:alert, no distress and comfortable SKIN: skin color, texture, turgor are normal, no rashes or significant lesions EYES: normal, Conjunctiva are pink  and non-injected, sclera clear OROPHARYNX:no exudate, no erythema and lips, buccal mucosa, and tongue normal  NECK: supple, thyroid normal size, non-tender, without nodularity LYMPH:  no palpable lymphadenopathy in the cervical, axillary or inguinal LUNGS: clear to auscultation and percussion with normal breathing effort HEART: regular rate & rhythm and no murmurs and no lower extremity edema ABDOMEN:abdomen soft, non-tender and normal bowel sounds Musculoskeletal:no cyanosis of digits and no clubbing  NEURO: alert & oriented x 3 with fluent speech, no focal motor/sensory deficits Breast exam: No palpable abnormalities are noted in the left breast in the right breast that appears to be without any nodularity and no lymph nodes are palpable   LABORATORY DATA:  I have reviewed the data as listed Appointment on 06/04/2014  Component Date Value Ref Range Status  . WBC 06/04/2014 7.4  3.9 - 10.3 10e3/uL Final  . NEUT# 06/04/2014 4.6  1.5 - 6.5 10e3/uL Final  . HGB 06/04/2014 12.6  11.6 - 15.9 g/dL Final  . HCT 06/04/2014 38.3  34.8 - 46.6 % Final  . Platelets 06/04/2014 296  145 - 400 10e3/uL Final  . MCV 06/04/2014 89.0  79.5 - 101.0 fL Final  . MCH 06/04/2014 29.3  25.1 - 34.0 pg Final  . MCHC 06/04/2014 32.9  31.5 - 36.0 g/dL Final  . RBC 06/04/2014 4.30  3.70 - 5.45 10e6/uL Final  . RDW 06/04/2014 13.3  11.2 - 14.5 % Final  . lymph# 06/04/2014 1.9  0.9 - 3.3 10e3/uL Final  . MONO# 06/04/2014 0.5  0.1 - 0.9 10e3/uL Final  . Eosinophils Absolute 06/04/2014 0.3  0.0 - 0.5 10e3/uL Final  . Basophils Absolute 06/04/2014 0.1  0.0 - 0.1 10e3/uL Final  . NEUT% 06/04/2014 62.8  38.4 - 76.8 % Final  . LYMPH% 06/04/2014 25.3  14.0 - 49.7 % Final  . MONO% 06/04/2014 7.2  0.0 - 14.0 % Final  . EOS% 06/04/2014 3.8  0.0 - 7.0 % Final  . BASO% 06/04/2014 0.9  0.0 - 2.0 % Final  . Sodium 06/04/2014 141  136 - 145 mEq/L Final  . Potassium 06/04/2014 3.5  3.5 - 5.1 mEq/L Final  . Chloride 06/04/2014  106  98 - 109 mEq/L Final  . CO2 06/04/2014 24  22 - 29 mEq/L Final  . Glucose 06/04/2014 95  70 - 140 mg/dl Final  . BUN 06/04/2014 20.1  7.0 - 26.0 mg/dL Final  . Creatinine 06/04/2014 1.6* 0.6 - 1.1 mg/dL Final  . Total Bilirubin 06/04/2014 0.35  0.20 - 1.20 mg/dL Final  . Alkaline Phosphatase 06/04/2014 88  40 - 150 U/L Final  . AST 06/04/2014 18  5 - 34 U/L Final  . ALT 06/04/2014 13  0 - 55 U/L Final  . Total Protein 06/04/2014 7.4  6.4 - 8.3 g/dL Final  . Albumin 06/04/2014 3.9  3.5 - 5.0 g/dL Final  . Calcium 06/04/2014 9.7  8.4 -  10.4 mg/dL Final  . Anion Gap 06/04/2014 11  3 - 11 mEq/L Final    RADIOGRAPHIC STUDIES: I have personally reviewed the radiological images as listed and agreed with the findings in the report. No results found.   ASSESSMENT & PLAN:  Breast cancer, right Left breast invasive lobular carcinoma T2, N0, M0 stage II ER/PR positive HER-2/neu negative. Patient is currently on antiestrogen therapy with Arimidex tolerating it very well without any major problems. I reviewed her recent mammograms and bone density test and they were all within normal limits. Breast exam did not reveal any abnormalities either. Patient has excellent bones. I encouraged her to continue calcium vitamin D.  Return to clinic in one year with mammogram of the left breast and followup.   Orders Placed This Encounter  Procedures  . MM Digital Diagnostic Unilat L    Standing Status: Future     Number of Occurrences:      Standing Expiration Date: 06/24/2015    Order Specific Question:  Reason for Exam (SYMPTOM  OR DIAGNOSIS REQUIRED)    Answer:  Annual mammogram H/O breast cancer    Order Specific Question:  Preferred imaging location?    Answer:  [facility]  . CBC with Differential    Standing Status: Future     Number of Occurrences:      Standing Expiration Date: 06/24/2015  . Comprehensive metabolic panel (Cmet) - [facility]    Standing Status: Future     Number of  Occurrences:      Standing Expiration Date: 06/24/2015   The patient has a good understanding of the overall plan. she agrees with it. She will call with any problems that may develop before her next visit here.  I spent 20 minutes counseling the patient face to face. The total time spent in the appointment was 30 minutes and more than 50% was on counseling and review of test results    Rulon Eisenmenger, MD 06/24/2014 4:42 PM

## 2014-06-24 NOTE — Assessment & Plan Note (Signed)
Left breast invasive lobular carcinoma T2, N0, M0 stage II ER/PR positive HER-2/neu negative. Patient is currently on antiestrogen therapy with Arimidex tolerating it very well without any major problems. I reviewed her recent mammograms and bone density test and they were all within normal limits. Breast exam did not reveal any abnormalities either. Patient has excellent bones. I encouraged her to continue calcium vitamin D.  Return to clinic in one year with mammogram of the left breast and followup.

## 2014-06-29 ENCOUNTER — Other Ambulatory Visit: Payer: Self-pay | Admitting: Hematology and Oncology

## 2014-06-29 ENCOUNTER — Telehealth: Payer: Self-pay | Admitting: Hematology and Oncology

## 2014-06-29 NOTE — Telephone Encounter (Signed)
m, °

## 2015-02-01 ENCOUNTER — Telehealth: Payer: Self-pay | Admitting: Hematology and Oncology

## 2015-02-01 NOTE — Telephone Encounter (Signed)
Called patient and she is aware of her new appt and i have mailed her a letter and a new calendar   Jean [name]

## 2015-05-17 ENCOUNTER — Ambulatory Visit
Admission: RE | Admit: 2015-05-17 | Discharge: 2015-05-17 | Disposition: A | Discharge status: Home / Self Care | Payer: Medicare Other | Source: Ambulatory Visit | Attending: Hematology and Oncology | Admitting: Hematology and Oncology

## 2015-05-17 DIAGNOSIS — C50911 Malignant neoplasm of unspecified site of right female breast: Secondary | ICD-10-CM

## 2015-06-27 ENCOUNTER — Other Ambulatory Visit: Payer: Medicare Other

## 2015-06-27 ENCOUNTER — Ambulatory Visit: Payer: Medicare Other | Admitting: Hematology and Oncology

## 2015-07-08 ENCOUNTER — Other Ambulatory Visit: Payer: Self-pay

## 2015-07-08 DIAGNOSIS — C50911 Malignant neoplasm of unspecified site of right female breast: Secondary | ICD-10-CM

## 2015-07-11 ENCOUNTER — Other Ambulatory Visit: Payer: Self-pay

## 2015-07-11 ENCOUNTER — Ambulatory Visit: Payer: Self-pay | Admitting: Hematology and Oncology

## 2015-07-11 ENCOUNTER — Telehealth: Payer: Self-pay | Admitting: Hematology and Oncology

## 2015-07-11 NOTE — Telephone Encounter (Signed)
Returned patients call as she did call and leave a message to cancel todays appointment as she has had a spinal inj.  She has rescheduled

## 2015-07-26 ENCOUNTER — Ambulatory Visit (HOSPITAL_BASED_OUTPATIENT_CLINIC_OR_DEPARTMENT_OTHER): Payer: Medicare Other | Admitting: Hematology and Oncology

## 2015-07-26 ENCOUNTER — Encounter: Payer: Self-pay | Admitting: Hematology and Oncology

## 2015-07-26 ENCOUNTER — Telehealth: Payer: Self-pay | Admitting: Hematology and Oncology

## 2015-07-26 ENCOUNTER — Other Ambulatory Visit (HOSPITAL_BASED_OUTPATIENT_CLINIC_OR_DEPARTMENT_OTHER): Payer: Medicare Other

## 2015-07-26 VITALS — BP 153/53 | HR 51 | Temp 99.7°F | Resp 17 | Ht 64.0 in | Wt 161.5 lb

## 2015-07-26 DIAGNOSIS — C50911 Malignant neoplasm of unspecified site of right female breast: Secondary | ICD-10-CM

## 2015-07-26 DIAGNOSIS — Z79811 Long term (current) use of aromatase inhibitors: Secondary | ICD-10-CM | POA: Diagnosis not present

## 2015-07-26 DIAGNOSIS — Z170 Estrogen receptor positive status [ER+]: Secondary | ICD-10-CM | POA: Diagnosis not present

## 2015-07-26 DIAGNOSIS — C50411 Malignant neoplasm of upper-outer quadrant of right female breast: Secondary | ICD-10-CM

## 2015-07-26 LAB — COMPREHENSIVE METABOLIC PANEL (CC13)
ALK PHOS: 90 U/L (ref 40–150)
ALT: 8 U/L (ref 0–55)
AST: 13 U/L (ref 5–34)
Albumin: 3.5 g/dL (ref 3.5–5.0)
Anion Gap: 8 mEq/L (ref 3–11)
BUN: 19.5 mg/dL (ref 7.0–26.0)
CALCIUM: 9.5 mg/dL (ref 8.4–10.4)
CO2: 28 mEq/L (ref 22–29)
CREATININE: 1.4 mg/dL — AB (ref 0.6–1.1)
Chloride: 105 mEq/L (ref 98–109)
EGFR: 36 mL/min/{1.73_m2} — ABNORMAL LOW (ref >90)
Glucose: 148 mg/dl — ABNORMAL HIGH (ref 70–140)
Potassium: 3.6 mEq/L (ref 3.5–5.1)
Sodium: 142 mEq/L (ref 136–145)
TOTAL PROTEIN: 6.9 g/dL (ref 6.4–8.3)
Total Bilirubin: 0.57 mg/dL (ref 0.20–1.20)

## 2015-07-26 LAB — CBC WITH DIFFERENTIAL/PLATELET
BASO%: 0.5 % (ref 0.0–2.0)
BASOS ABS: 0 10*3/uL (ref 0.0–0.1)
EOS%: 1.7 % (ref 0.0–7.0)
Eosinophils Absolute: 0.2 10*3/uL (ref 0.0–0.5)
HEMATOCRIT: 36.4 % (ref 34.8–46.6)
HGB: 12.3 g/dL (ref 11.6–15.9)
LYMPH#: 1.6 10*3/uL (ref 0.9–3.3)
LYMPH%: 17.9 % (ref 14.0–49.7)
MCH: 29.6 pg (ref 25.1–34.0)
MCHC: 33.7 g/dL (ref 31.5–36.0)
MCV: 87.9 fL (ref 79.5–101.0)
MONO#: 0.6 10*3/uL (ref 0.1–0.9)
MONO%: 6.1 % (ref 0.0–14.0)
NEUT#: 6.7 10*3/uL — ABNORMAL HIGH (ref 1.5–6.5)
NEUT%: 73.8 % (ref 38.4–76.8)
Platelets: 266 10*3/uL (ref 145–400)
RBC: 4.14 10*6/uL (ref 3.70–5.45)
RDW: 13.6 % (ref 11.2–14.5)
WBC: 9 10*3/uL (ref 3.9–10.3)

## 2015-07-26 NOTE — Progress Notes (Signed)
Patient Care Team: Sheral Apley, MD as PCP - General (Family Medicine)  DIAGNOSIS: Breast cancer, right   Staging form: Breast, AJCC 7th Edition     Clinical: T1c, N0, cM0 - Unsigned   SUMMARY OF ONCOLOGIC HISTORY:   Breast cancer, right   04/30/2012 Initial Diagnosis Breast cancer, right  invasive lobular carcinoma. 2.7 cm ER positive PR positive HER-2/neu negative invasive lobular carcinoma. Sentinel node biopsy was not performed.   06/10/2012 -  Anti-estrogen oral therapy Arimidex 1 mg daily starting 06/10/2012    CHIEF COMPLIANT: Follow-up of breast cancer on Arimidex  INTERVAL HISTORY: Jean [name] is a 81 year old above-mentioned history of right breast cancer treated with right mastectomy and is now on Arimidex therapy. She is tolerating it extremely well. Her only complaint today is low back pain. She is going to be 86 stone and is very excited about it. She looks very healthy for her age.  REVIEW OF SYSTEMS:   Constitutional: Denies fevers, chills or abnormal weight loss Eyes: Denies blurriness of vision Ears, nose, mouth, throat, and face: Denies mucositis or sore throat Respiratory: Denies cough, dyspnea or wheezes Cardiovascular: Denies palpitation, chest discomfort or lower extremity swelling Gastrointestinal:  Denies nausea, heartburn or change in bowel habits Skin: Denies abnormal skin rashes Lymphatics: Denies new lymphadenopathy or easy bruising Neurological:Denies numbness, tingling or new weaknesses Behavioral/Psych: Mood is stable, no new changes  Breast:  denies any pain or lumps or nodules in either breasts All other systems were reviewed with the patient and are negative.  I have reviewed the past medical history, past surgical history, social history and family history with the patient and they are unchanged from previous note.  ALLERGIES:  is allergic to cephalexin; hydromorphone hcl; penicillins; avelox; pentazocine; and sulfa antibiotics.  MEDICATIONS:   Current Outpatient Prescriptions  Medication Sig Dispense Refill  . AMLODIPINE BESYLATE PO Take 5 mg by mouth daily.    Marland Kitchen anastrozole (ARIMIDEX) 1 MG tablet Take 1 mg by mouth daily.    . baclofen (LIORESAL) 10 MG tablet TAKE 1/2 TO 1 TABLET 3 TIMES DAILY AS NEEDED FOR SPASMS  2  . bumetanide (BUMEX) 1 MG tablet Take 1 mg by mouth daily.    . ferrous sulfate 325 (65 FE) MG tablet Take 325 mg by mouth daily with breakfast.    . hydrALAZINE (APRESOLINE) 25 MG tablet Take 25 mg by mouth 3 (three) times daily.     . meloxicam (MOBIC) 15 MG tablet Take 15 mg by mouth daily.    . nadolol (CORGARD) 40 MG tablet Take 20 mg by mouth daily.     . potassium chloride (K-DUR) 10 MEQ tablet Take 20 mEq by mouth daily.    . pravastatin (PRAVACHOL) 40 MG tablet Take 40 mg by mouth daily.    . ramelteon (ROZEREM) 8 MG tablet Take 8 mg by mouth.    . temazepam (RESTORIL) 15 MG capsule Take 15 mg by mouth at bedtime as needed for sleep.     . mirtazapine (REMERON) 15 MG tablet Take 15 mg by mouth at bedtime.    Marland Kitchen PARoxetine (PAXIL) 20 MG tablet Take 20 mg by mouth daily.     No current facility-administered medications for this visit.    PHYSICAL EXAMINATION: ECOG PERFORMANCE STATUS: 1 - Symptomatic but completely ambulatory  Filed Vitals:   07/26/15 0858  BP: 153/53  Pulse: 51  Temp: 99.7 F (37.6 C)  Resp: 17   Filed Weights   07/26/15 0858  Weight: 161 lb 8 oz (73.256 kg)    GENERAL:alert, no distress and comfortable SKIN: skin color, texture, turgor are normal, no rashes or significant lesions EYES: normal, Conjunctiva are pink and non-injected, sclera clear OROPHARYNX:no exudate, no erythema and lips, buccal mucosa, and tongue normal  NECK: supple, thyroid normal size, non-tender, without nodularity LYMPH:  no palpable lymphadenopathy in the cervical, axillary or inguinal LUNGS: clear to auscultation and percussion with normal breathing effort HEART: regular rate & rhythm and no  murmurs and no lower extremity edema ABDOMEN:abdomen soft, non-tender and normal bowel sounds Musculoskeletal:no cyanosis of digits and no clubbing  NEURO: alert & oriented x 3 with fluent speech, no focal motor/sensory deficits BREAST: No palpable masses in the left breast right chest wall and axilla are without any lumps or nodules. (exam performed in the presence of a chaperone)  LABORATORY DATA:  I have reviewed the data as listed   Chemistry      Component Value Date/Time   NA 141 06/04/2014 1003   NA 141 02/17/2013 0749   K 3.5 06/04/2014 1003   K 3.8 02/17/2013 0749   CL 103 02/17/2013 0749   CL 101 01/09/2013 0943   CO2 24 06/04/2014 1003   CO2 26 02/17/2013 0749   BUN 20.1 06/04/2014 1003   BUN 16 02/17/2013 0749   CREATININE 1.6* 06/04/2014 1003   CREATININE 0.99 02/17/2013 0749      Component Value Date/Time   CALCIUM 9.7 06/04/2014 1003   CALCIUM 10.2 02/17/2013 0749   ALKPHOS 88 06/04/2014 1003   ALKPHOS 71 04/30/2012 0806   AST 18 06/04/2014 1003   AST 22 04/30/2012 0806   ALT 13 06/04/2014 1003   ALT 14 04/30/2012 0806   BILITOT 0.35 06/04/2014 1003   BILITOT 0.3 04/30/2012 0806       Lab Results  Component Value Date   WBC 9.0 07/26/2015   HGB 12.3 07/26/2015   HCT 36.4 07/26/2015   MCV 87.9 07/26/2015   PLT 266 07/26/2015   NEUTROABS 6.7* 07/26/2015   Laboratory ASSESSMENT & PLAN:  Breast cancer, right Right breast invasive lobular cancer, 2.7 cm, ER/PR 100% positive, HER-2 negative ratio 1.12, Ki-67 31% sentinel node biopsy not performed status post right simple mastectomy 05/16/2012, grade 2-3, with DCIS grade 2, T2 NX stage II a pathologic staging, started Arimidex 06/10/2012  Arimidex toxicities: Tolerating it very well without any major problems or concerns. Denies any hot flashes or myalgias. Bone density 05/12/2014: T score -0.9 normal continue with calcium and vitamin D  Breast Cancer Surveillance: 1. Breast exam 07/26/2015: Normal 2.  Mammogram 05/17/2015 No abnormalities left breast. Breast Density Category B. I recommended that she get 3-D mammograms for surveillance. Discussed the differences between different breast density categories.   Her blood counts were reviewed. Return to clinic in 1 year for follow-up   No orders of the defined types were placed in this encounter.   The patient has a good understanding of the overall plan. she agrees with it. she will call with any problems that may develop before the next visit here.   Rulon Eisenmenger, MD

## 2015-07-26 NOTE — Assessment & Plan Note (Signed)
Right breast invasive lobular cancer, 2.7 cm, ER/PR 100% positive, HER-2 negative ratio 1.12, Ki-67 31% sentinel node biopsy not performed status post right simple mastectomy 05/16/2012, grade 2-3, with DCIS grade 2, T2 NX stage II a pathologic staging, started Arimidex 06/10/2012  Arimidex toxicities: Tolerating it very well without any major problems or concerns. Denies any hot flashes or myalgias. Bone density 05/12/2014: T score -0.9 normal continue with calcium and vitamin D  Return to clinic in 1 year for follow-up

## 2015-07-26 NOTE — Telephone Encounter (Signed)
Appointments made and avs printed for patient °

## 2015-10-17 ENCOUNTER — Encounter: Payer: Self-pay | Admitting: Internal Medicine

## 2016-04-11 ENCOUNTER — Other Ambulatory Visit: Payer: Self-pay

## 2016-04-11 DIAGNOSIS — Z9011 Acquired absence of right breast and nipple: Secondary | ICD-10-CM

## 2016-04-11 DIAGNOSIS — Z1231 Encounter for screening mammogram for malignant neoplasm of breast: Secondary | ICD-10-CM

## 2016-05-18 ENCOUNTER — Other Ambulatory Visit: Payer: Self-pay | Admitting: Family Medicine

## 2016-05-18 ENCOUNTER — Ambulatory Visit
Admission: RE | Admit: 2016-05-18 | Discharge: 2016-05-18 | Disposition: A | Discharge status: Home / Self Care | Payer: Medicare Other | Source: Ambulatory Visit

## 2016-05-18 DIAGNOSIS — Z9011 Acquired absence of right breast and nipple: Secondary | ICD-10-CM

## 2016-05-18 DIAGNOSIS — Z1231 Encounter for screening mammogram for malignant neoplasm of breast: Secondary | ICD-10-CM

## 2016-07-26 ENCOUNTER — Ambulatory Visit (HOSPITAL_BASED_OUTPATIENT_CLINIC_OR_DEPARTMENT_OTHER): Payer: Medicare Other | Admitting: Hematology and Oncology

## 2016-07-26 ENCOUNTER — Encounter: Payer: Self-pay | Admitting: Hematology and Oncology

## 2016-07-26 DIAGNOSIS — C50511 Malignant neoplasm of lower-outer quadrant of right female breast: Secondary | ICD-10-CM | POA: Diagnosis not present

## 2016-07-26 DIAGNOSIS — Z170 Estrogen receptor positive status [ER+]: Secondary | ICD-10-CM

## 2016-07-26 DIAGNOSIS — Z79811 Long term (current) use of aromatase inhibitors: Secondary | ICD-10-CM | POA: Diagnosis not present

## 2016-07-26 NOTE — Progress Notes (Signed)
Patient Care Team: Sheral Apley, MD as PCP - General (Family Medicine)  DIAGNOSIS: Breast cancer of lower-outer quadrant of right female breast [facility])   Staging form: Breast, AJCC 7th Edition   - Clinical: T1c, N0, cM0 - Unsigned  SUMMARY OF ONCOLOGIC HISTORY:   Breast cancer of lower-outer quadrant of right female breast ([place])   04/30/2012 Initial Diagnosis    Breast cancer, right  invasive lobular carcinoma. 2.7 cm ER positive PR positive HER-2/neu negative invasive lobular carcinoma. Sentinel node biopsy was not performed.      06/10/2012 -  Anti-estrogen oral therapy    Arimidex 1 mg daily starting 06/10/2012       CHIEF COMPLIANT: Follow-up on Arimidex, complaining of right shoulder pain  INTERVAL HISTORY: Jean [name] is a 82 year old with above-mentioned history of right breast cancer treated with mastectomy and is now on Arimidex therapy since July 2013. She is tolerating Arimidex fairly well. Recently she has noticed increasing right shoulder pain. This has gotten worse since she was taken off all week which was helping her tremendously.  REVIEW OF SYSTEMS:   Constitutional: Denies fevers, chills or abnormal weight loss Eyes: Denies blurriness of vision Ears, nose, mouth, throat, and face: Denies mucositis or sore throat Respiratory: Denies cough, dyspnea or wheezes Cardiovascular: Denies palpitation, chest discomfort Gastrointestinal:  Denies nausea, heartburn or change in bowel habits Skin: Denies abnormal skin rashes Lymphatics: Denies new lymphadenopathy or easy bruising Neurological:Denies numbness, tingling or new weaknesses Behavioral/Psych: Mood is stable, no new changes  Extremities: No lower extremity edema Breast:  denies any pain or lumps or nodules in either breasts All other systems were reviewed with the patient and are negative.  I have reviewed the past medical history, past surgical history, social history and family history with the patient and they  are unchanged from previous note.  ALLERGIES:  is allergic to cephalexin; hydromorphone hcl; penicillins; avelox [moxifloxacin hcl in nacl]; pentazocine; and sulfa antibiotics.  MEDICATIONS:  Current Outpatient Prescriptions  Medication Sig Dispense Refill  . AMLODIPINE BESYLATE PO Take 5 mg by mouth daily.    Marland Kitchen anastrozole (ARIMIDEX) 1 MG tablet Take 1 mg by mouth daily.    . bumetanide (BUMEX) 1 MG tablet Take 1 mg by mouth daily.    . ferrous sulfate 325 (65 FE) MG tablet Take 325 mg by mouth daily with breakfast.    . hydrALAZINE (APRESOLINE) 25 MG tablet Take 25 mg by mouth 3 (three) times daily.     . mirtazapine (REMERON) 15 MG tablet Take 15 mg by mouth at bedtime.    . nadolol (CORGARD) 40 MG tablet Take 20 mg by mouth daily.     Marland Kitchen PARoxetine (PAXIL) 20 MG tablet Take 20 mg by mouth daily.    . potassium chloride (K-DUR) 10 MEQ tablet Take 20 mEq by mouth daily.    . pravastatin (PRAVACHOL) 40 MG tablet Take 40 mg by mouth daily.     No current facility-administered medications for this visit.     PHYSICAL EXAMINATION: ECOG PERFORMANCE STATUS: 1 - Symptomatic but completely ambulatory  Vitals:   07/26/16 0955  BP: (!) 154/84  Pulse: 67  Resp: 18  Temp: 98.4 F (36.9 C)   Filed Weights   07/26/16 0955  Weight: 168 lb 12.8 oz (76.6 kg)    GENERAL:alert, no distress and comfortable SKIN: skin color, texture, turgor are normal, no rashes or significant lesions EYES: normal, Conjunctiva are pink and non-injected, sclera clear OROPHARYNX:no exudate, no  erythema and lips, buccal mucosa, and tongue normal  NECK: supple, thyroid normal size, non-tender, without nodularity LYMPH:  no palpable lymphadenopathy in the cervical, axillary or inguinal LUNGS: clear to auscultation and percussion with normal breathing effort HEART: regular rate & rhythm and no murmurs and no lower extremity edema ABDOMEN:abdomen soft, non-tender and normal bowel sounds MUSCULOSKELETAL:no cyanosis  of digits and no clubbing  NEURO: alert & oriented x 3 with fluent speech, no focal motor/sensory deficits EXTREMITIES: No lower extremity edema BRNo palpable lumps or nodules in the right chest wall and axilla, no palpable lumps nodules in the left breast or axilla.No palpable axillary supraclavicular or infraclavicular adenopathy no breast tenderness or nipple discharge. (exam performed in the presence of a chaperone)  LABORATORY DATA:  I have reviewed the data as listed   Chemistry      Component Value Date/Time   NA 142 07/26/2015 0846   K 3.6 07/26/2015 0846   CL 103 02/17/2013 0749   CL 101 01/09/2013 0943   CO2 28 07/26/2015 0846   BUN 19.5 07/26/2015 0846   CREATININE 1.4 (H) 07/26/2015 0846      Component Value Date/Time   CALCIUM 9.5 07/26/2015 0846   ALKPHOS 90 07/26/2015 0846   AST 13 07/26/2015 0846   ALT 8 07/26/2015 0846   BILITOT 0.57 07/26/2015 0846       Lab Results  Component Value Date   WBC 9.0 07/26/2015   HGB 12.3 07/26/2015   HCT 36.4 07/26/2015   MCV 87.9 07/26/2015   PLT 266 07/26/2015   NEUTROABS 6.7 (H) 07/26/2015     ASSESSMENT & PLAN:  Breast cancer of lower-outer quadrant of right female breast ([place]) Right breast invasive lobular cancer, 2.7 cm, ER/PR 100% positive, HER-2 negative ratio 1.12, Ki-67 31% sentinel node biopsy not performed status post right simple mastectomy 05/16/2012, grade 2-3, with DCIS grade 2, T2 NX stage II a pathologic staging, started Arimidex 06/10/2012  Arimidex toxicities: Tolerating it very well without any major problems or concerns. Denies any hot flashes . Patient is not experiencing right shoulder pain. I did not certain that it is Arimidex related. I instructed her to stop Arimidex for a month. If her symptoms improve then she should stop Arimidex indefinitely. If her symptoms do not improve then she can resume taking it.  Bone density 05/12/2014: T score -0.9 normal continue with calcium and vitamin  D  Breast Cancer Surveillance: 1. Breast exam 07/26/2016: No palpable lumps or nodules that are of concern 2. Mammogram left breast 05/16/2016 No abnormalities suspicious for malignancy. Breast Density Category B. I recommended that she get 3-D mammograms for surveillance. Discussed the differences between different breast density categories.   Return to clinic in 1 year for follow-up  No orders of the defined types were placed in this encounter.  The patient has a good understanding of the overall plan. she agrees with it. she will call with any problems that may develop before the next visit here.   Rulon Eisenmenger, MD 07/26/16

## 2016-07-26 NOTE — Assessment & Plan Note (Signed)
Right breast invasive lobular cancer, 2.7 cm, ER/PR 100% positive, HER-2 negative ratio 1.12, Ki-67 31% sentinel node biopsy not performed status post right simple mastectomy 05/16/2012, grade 2-3, with DCIS grade 2, T2 NX stage II a pathologic staging, started Arimidex 06/10/2012  Arimidex toxicities: Tolerating it very well without any major problems or concerns. Denies any hot flashes or myalgias. Bone density 05/12/2014: T score -0.9 normal continue with calcium and vitamin D  Breast Cancer Surveillance: 1. Breast exam 07/11/2015: No palpable lumps or nodules that are of concern 2. Mammogram left breast 05/17/2015 No abnormalities suspicious for malignancy. Breast Density Category B. I recommended that she get 3-D mammograms for surveillance. Discussed the differences between different breast density categories.   Return to clinic in 1 year for follow-up

## 2016-08-15 ENCOUNTER — Ambulatory Visit (INDEPENDENT_AMBULATORY_CARE_PROVIDER_SITE_OTHER): Payer: Medicare Other | Admitting: Orthopedic Surgery

## 2016-08-15 ENCOUNTER — Ambulatory Visit (INDEPENDENT_AMBULATORY_CARE_PROVIDER_SITE_OTHER): Payer: Self-pay | Admitting: Orthopedic Surgery

## 2016-08-15 DIAGNOSIS — M1711 Unilateral primary osteoarthritis, right knee: Secondary | ICD-10-CM | POA: Diagnosis not present

## 2016-08-15 DIAGNOSIS — M25511 Pain in right shoulder: Secondary | ICD-10-CM

## 2016-10-17 ENCOUNTER — Encounter (INDEPENDENT_AMBULATORY_CARE_PROVIDER_SITE_OTHER): Payer: Self-pay | Admitting: Orthopedic Surgery

## 2016-10-17 ENCOUNTER — Ambulatory Visit (INDEPENDENT_AMBULATORY_CARE_PROVIDER_SITE_OTHER): Payer: Medicare Other | Admitting: Orthopedic Surgery

## 2016-10-17 DIAGNOSIS — G8929 Other chronic pain: Secondary | ICD-10-CM | POA: Diagnosis not present

## 2016-10-17 DIAGNOSIS — M1711 Unilateral primary osteoarthritis, right knee: Secondary | ICD-10-CM | POA: Diagnosis not present

## 2016-10-17 DIAGNOSIS — M25461 Effusion, right knee: Secondary | ICD-10-CM

## 2016-10-17 DIAGNOSIS — M25511 Pain in right shoulder: Secondary | ICD-10-CM

## 2016-10-17 MED ORDER — OXYCODONE HCL 5 MG PO CAPS: 5.0000 mg | ORAL_CAPSULE | Freq: Two times a day (BID) | ORAL | 0 refills | Status: AC

## 2016-10-18 DIAGNOSIS — M25461 Effusion, right knee: Secondary | ICD-10-CM

## 2016-10-18 MED ORDER — LIDOCAINE HCL 1 % IJ SOLN
5.0000 mL | INTRAMUSCULAR | Status: AC | PRN
Start: 2016-10-18 — End: 2016-10-18
  Administered 2016-10-18: 5 mL
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
## 2016-12-05 ENCOUNTER — Encounter (INDEPENDENT_AMBULATORY_CARE_PROVIDER_SITE_OTHER): Payer: Self-pay | Admitting: Orthopedic Surgery

## 2016-12-05 ENCOUNTER — Ambulatory Visit (INDEPENDENT_AMBULATORY_CARE_PROVIDER_SITE_OTHER): Payer: Medicare Other | Admitting: Orthopedic Surgery

## 2016-12-05 DIAGNOSIS — G8929 Other chronic pain: Secondary | ICD-10-CM | POA: Diagnosis not present

## 2016-12-05 DIAGNOSIS — M25511 Pain in right shoulder: Secondary | ICD-10-CM | POA: Diagnosis not present

## 2016-12-05 DIAGNOSIS — M1711 Unilateral primary osteoarthritis, right knee: Secondary | ICD-10-CM | POA: Diagnosis not present

## 2016-12-05 MED ORDER — METHYLPREDNISOLONE ACETATE 40 MG/ML IJ SUSP
40.0000 mg | INTRAMUSCULAR | Status: AC | PRN
  Administered 2016-12-05: 40 mg via INTRA_ARTICULAR

## 2016-12-05 MED ORDER — LIDOCAINE HCL 1 % IJ SOLN
5.0000 mL | INTRAMUSCULAR | Status: AC | PRN
  Administered 2016-12-05: 5 mL

## 2016-12-05 MED ORDER — BUPIVACAINE HCL 0.5 % IJ SOLN
9.0000 mL | INTRAMUSCULAR | Status: AC | PRN
  Administered 2016-12-05: 9 mL via INTRA_ARTICULAR
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
## 2016-12-18 ENCOUNTER — Ambulatory Visit
Admission: RE | Admit: 2016-12-18 | Discharge: 2016-12-18 | Disposition: A | Discharge status: Home / Self Care | Payer: Medicare Other | Source: Ambulatory Visit | Attending: Orthopedic Surgery | Admitting: Orthopedic Surgery

## 2016-12-18 DIAGNOSIS — M25511 Pain in right shoulder: Principal | ICD-10-CM

## 2016-12-18 DIAGNOSIS — G8929 Other chronic pain: Secondary | ICD-10-CM

## 2016-12-20 ENCOUNTER — Ambulatory Visit (INDEPENDENT_AMBULATORY_CARE_PROVIDER_SITE_OTHER): Payer: Medicare Other | Admitting: Orthopedic Surgery

## 2016-12-24 ENCOUNTER — Ambulatory Visit (INDEPENDENT_AMBULATORY_CARE_PROVIDER_SITE_OTHER): Payer: Medicare Other | Admitting: Orthopedic Surgery

## 2016-12-24 ENCOUNTER — Encounter (INDEPENDENT_AMBULATORY_CARE_PROVIDER_SITE_OTHER): Payer: Self-pay | Admitting: Orthopedic Surgery

## 2016-12-24 ENCOUNTER — Telehealth (INDEPENDENT_AMBULATORY_CARE_PROVIDER_SITE_OTHER): Payer: Self-pay | Admitting: *Deleted

## 2016-12-24 DIAGNOSIS — M19011 Primary osteoarthritis, right shoulder: Secondary | ICD-10-CM

## 2016-12-24 MED ORDER — OXYCODONE HCL 5 MG PO TABS: 5.0000 mg | ORAL_TABLET | Freq: Every day | ORAL | 0 refills | Status: DC | PRN

## 2016-12-24 NOTE — Telephone Encounter (Signed)
Patient's daughter Jean [name] called this afternoon in regards to her mother's visit today. She had a few questions regarding what happened on today's visit. She would like to know what her mom was prescribed please. Her CB # [phone]. Thank you
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
## 2016-12-25 NOTE — Telephone Encounter (Signed)
IC Dwyane Luo and discussed, she is concerned that her mother received pain meds yesterday.  She is in stage IV renal failure.  She has a history of overdosing on pain meds, daughter says they had to take her to the ED once for this.  She asks that we please be careful when prescribing these meds to her, and wants [place] to watch if she calls back for refills. I advised her what your note said, and that you prescribed the meds as PRN, and not to be taken on a daily basis.  FYI only

## 2016-12-25 NOTE — Telephone Encounter (Signed)
That is helpful information

## 2016-12-26 ENCOUNTER — Ambulatory Visit (INDEPENDENT_AMBULATORY_CARE_PROVIDER_SITE_OTHER): Payer: Medicare Other | Admitting: Orthopedic Surgery

## 2017-02-07 ENCOUNTER — Ambulatory Visit (INDEPENDENT_AMBULATORY_CARE_PROVIDER_SITE_OTHER): Payer: Medicare Other | Admitting: Orthopedic Surgery

## 2017-02-07 ENCOUNTER — Encounter (INDEPENDENT_AMBULATORY_CARE_PROVIDER_SITE_OTHER): Payer: Self-pay | Admitting: Orthopedic Surgery

## 2017-02-07 DIAGNOSIS — M19011 Primary osteoarthritis, right shoulder: Secondary | ICD-10-CM

## 2017-02-07 DIAGNOSIS — M1711 Unilateral primary osteoarthritis, right knee: Secondary | ICD-10-CM | POA: Diagnosis not present

## 2017-02-07 MED ORDER — BUPIVACAINE HCL 0.5 % IJ SOLN
9.0000 mL | INTRAMUSCULAR | Status: AC | PRN
  Administered 2017-02-07: 9 mL via INTRA_ARTICULAR

## 2017-02-07 MED ORDER — METHYLPREDNISOLONE ACETATE 40 MG/ML IJ SUSP
40.0000 mg | INTRAMUSCULAR | Status: AC | PRN
  Administered 2017-02-07: 40 mg via INTRA_ARTICULAR

## 2017-02-07 MED ORDER — BUPIVACAINE HCL 0.25 % IJ SOLN
4.0000 mL | INTRAMUSCULAR | Status: AC | PRN
  Administered 2017-02-07: 4 mL via INTRA_ARTICULAR

## 2017-02-07 MED ORDER — LIDOCAINE HCL 1 % IJ SOLN
5.0000 mL | INTRAMUSCULAR | Status: AC | PRN
  Administered 2017-02-07: 5 mL

## 2017-02-07 MED ORDER — OXYCODONE HCL 5 MG PO TABS: 5.0000 mg | ORAL_TABLET | Freq: Every day | ORAL | 0 refills | Status: DC | PRN

## 2017-02-07 NOTE — Addendum Note (Signed)
Addended byLaurann Montana on: 02/07/2017 10:59 AM   Modules accepted: Orders
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
## 2017-02-08 LAB — SYNOVIAL CELL COUNT + DIFF, W/ CRYSTALS
Basophils, %: 0 %
Eosinophils-Synovial: 0 % (ref 0–2)
Lymphocytes-Synovial Fld: 50 % (ref 0–74)
Monocyte/Macrophage: 16 % (ref 0–69)
Neutrophil, Synovial: 34 % — ABNORMAL HIGH (ref 0–24)
Synoviocytes, %: 0 % (ref 0–15)
WBC, Synovial: 5380 cells/uL — ABNORMAL HIGH (ref <150)

## 2017-02-12 LAB — BODY FLUID CULTURE
Gram Stain: NONE SEEN
Organism ID, Bacteria: NO GROWTH

## 2017-02-14 ENCOUNTER — Telehealth (INDEPENDENT_AMBULATORY_CARE_PROVIDER_SITE_OTHER): Payer: Self-pay | Admitting: Orthopedic Surgery

## 2017-02-14 ENCOUNTER — Other Ambulatory Visit (INDEPENDENT_AMBULATORY_CARE_PROVIDER_SITE_OTHER): Payer: Self-pay

## 2017-02-14 MED ORDER — METHYLPREDNISOLONE 4 MG PO TABS: ORAL_TABLET | ORAL | 0 refills | Status: AC

## 2017-02-14 NOTE — Telephone Encounter (Signed)
Please ask him about this.

## 2017-02-14 NOTE — Telephone Encounter (Signed)
Patient has pseudogout in her shoulder.  We aspirated the pseudogout.  She should try a Medrol Dosepak.  There is nothing stronger than the oxycodone for her pain.  She has severe endstage arthritis in the shoulder.  All cultures negative to date Lisbeth calledl her with this info

## 2017-02-14 NOTE — Telephone Encounter (Signed)
See message below.thanks

## 2017-02-14 NOTE — Telephone Encounter (Signed)
Per Dr Marlou Sa call in a steroid pack medrol dose pak.  Called into pharm. Called pt to let her know. No answer LMOM

## 2017-02-14 NOTE — Telephone Encounter (Signed)
Patient calling in ref to lab work (fluid was analyzed and she states it was gout ).  The R shoulder is causing her so much pain.  The oxycodone is not touching the pain.  Please advise ASAP

## 2017-02-15 NOTE — Telephone Encounter (Signed)
IC s/w patient and verified she understands.

## 2017-03-25 ENCOUNTER — Ambulatory Visit (INDEPENDENT_AMBULATORY_CARE_PROVIDER_SITE_OTHER): Payer: Medicare Other | Admitting: Orthopedic Surgery

## 2017-03-25 ENCOUNTER — Encounter (INDEPENDENT_AMBULATORY_CARE_PROVIDER_SITE_OTHER): Payer: Self-pay | Admitting: Orthopedic Surgery

## 2017-03-25 DIAGNOSIS — M1711 Unilateral primary osteoarthritis, right knee: Secondary | ICD-10-CM

## 2017-03-25 DIAGNOSIS — M19011 Primary osteoarthritis, right shoulder: Secondary | ICD-10-CM

## 2017-03-25 MED ORDER — TRAMADOL HCL 50 MG PO TABS: 50.0000 mg | ORAL_TABLET | Freq: Two times a day (BID) | ORAL | 0 refills | Status: DC | PRN

## 2017-03-25 MED ORDER — OXYCODONE HCL 5 MG PO TABS: 5.0000 mg | ORAL_TABLET | Freq: Every day | ORAL | 0 refills | Status: DC | PRN

## 2017-03-27 DIAGNOSIS — M1711 Unilateral primary osteoarthritis, right knee: Secondary | ICD-10-CM

## 2017-03-27 DIAGNOSIS — M19011 Primary osteoarthritis, right shoulder: Secondary | ICD-10-CM

## 2017-03-27 MED ORDER — LIDOCAINE HCL 1 % IJ SOLN
5.0000 mL | INTRAMUSCULAR | Status: AC | PRN
  Administered 2017-03-27: 5 mL
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
## 2017-04-01 ENCOUNTER — Ambulatory Visit (INDEPENDENT_AMBULATORY_CARE_PROVIDER_SITE_OTHER): Payer: Medicare Other | Admitting: Orthopedic Surgery

## 2017-04-09 ENCOUNTER — Other Ambulatory Visit: Payer: Self-pay | Admitting: Family Medicine

## 2017-04-09 DIAGNOSIS — Z1231 Encounter for screening mammogram for malignant neoplasm of breast: Secondary | ICD-10-CM

## 2017-05-20 ENCOUNTER — Ambulatory Visit
Admission: RE | Admit: 2017-05-20 | Discharge: 2017-05-20 | Disposition: A | Discharge status: Home / Self Care | Payer: Medicare Other | Source: Ambulatory Visit | Attending: Family Medicine | Admitting: Family Medicine

## 2017-05-20 DIAGNOSIS — Z1231 Encounter for screening mammogram for malignant neoplasm of breast: Secondary | ICD-10-CM

## 2017-05-22 ENCOUNTER — Ambulatory Visit (INDEPENDENT_AMBULATORY_CARE_PROVIDER_SITE_OTHER): Payer: Medicare Other | Admitting: Orthopedic Surgery

## 2017-05-22 ENCOUNTER — Encounter (INDEPENDENT_AMBULATORY_CARE_PROVIDER_SITE_OTHER): Payer: Self-pay | Admitting: Orthopedic Surgery

## 2017-05-22 DIAGNOSIS — M19011 Primary osteoarthritis, right shoulder: Secondary | ICD-10-CM

## 2017-05-22 DIAGNOSIS — G8929 Other chronic pain: Secondary | ICD-10-CM | POA: Diagnosis not present

## 2017-05-22 DIAGNOSIS — M25561 Pain in right knee: Secondary | ICD-10-CM | POA: Diagnosis not present

## 2017-05-22 MED ORDER — LIDOCAINE HCL 1 % IJ SOLN
5.0000 mL | INTRAMUSCULAR | Status: AC | PRN
  Administered 2017-05-22: 5 mL
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
## 2017-06-26 ENCOUNTER — Other Ambulatory Visit (INDEPENDENT_AMBULATORY_CARE_PROVIDER_SITE_OTHER): Payer: Self-pay | Admitting: Orthopedic Surgery

## 2017-06-26 NOTE — Telephone Encounter (Signed)
Can you advise if ok to rf? 

## 2017-07-19 IMAGING — MR MR SHOULDER*R* W/O CM
5 series · 34 of 40 positions shown · non-contrast
Comparison: None.

CLINICAL DATA: Right shoulder pain and limited range of motion with
weakness for several months.

EXAM:
MRI OF THE RIGHT SHOULDER WITHOUT CONTRAST
TECHNIQUE: Multiplanar, multisequence MR imaging of the shoulder was performed.
No intravenous contrast was administered.

[Series 3: T2 fat-sat · axial · 4.0mm · 0.55mm/px · z∈[-36,+48]mm · 8 of 19 slices shown (1 of 3)]
[im 1/19]
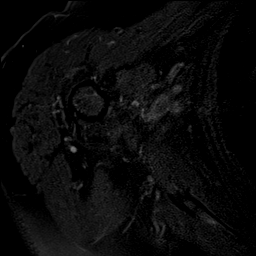
[im 3/19]
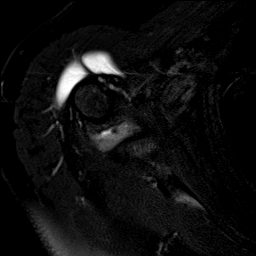
[im 6/19]
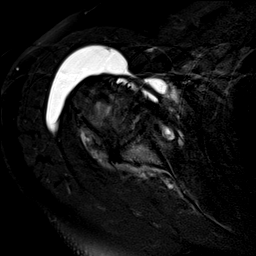
[im 8/19]
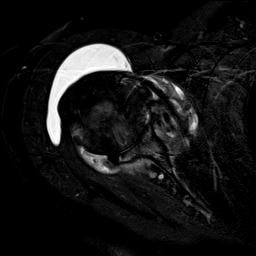
[im 11/19]
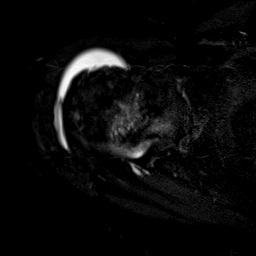
[im 13/19]
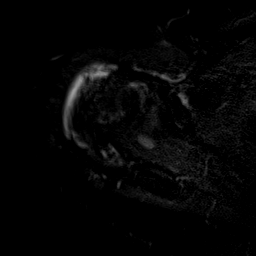
[im 16/19]
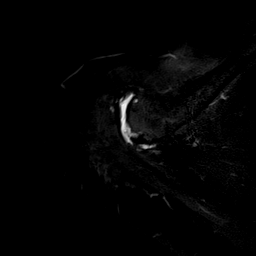
[im 19/19]
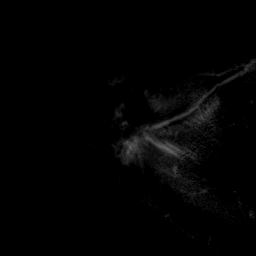

[Series 5: T2 fat-sat · oblique · 4.0mm · 0.55mm/px · 9 of 20 slices shown (2 of 3)]
[im 1/20]
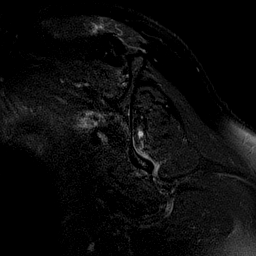
[im 3/20]
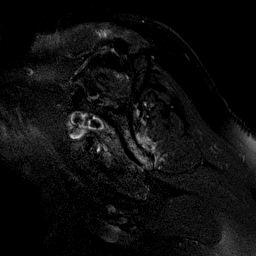
[im 5/20]
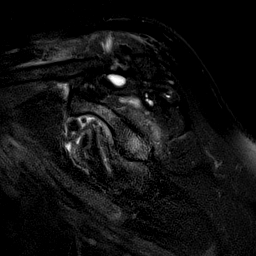
[im 8/20]
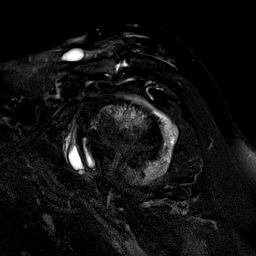
[im 10/20]
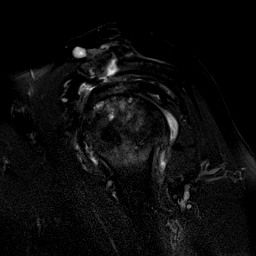
[im 12/20]
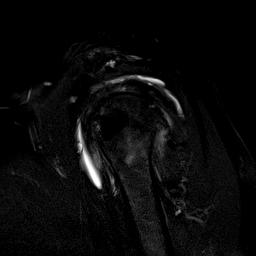
[im 15/20]
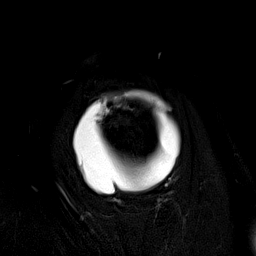
[im 17/20]
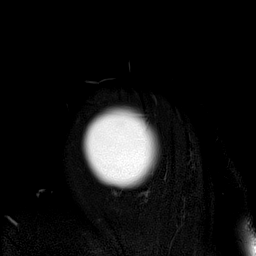
[im 20/20]
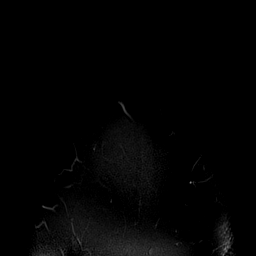

[Series 6: T2 fat-sat · oblique · 4.0mm · 0.27mm/px · 7 of 17 slices shown (3 of 3)]
[im 1/17]
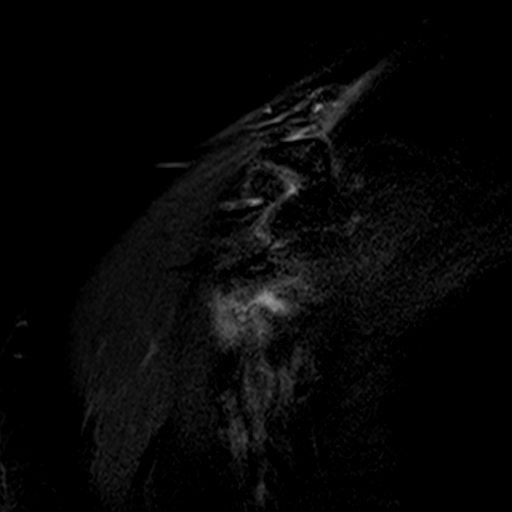
[im 3/17]
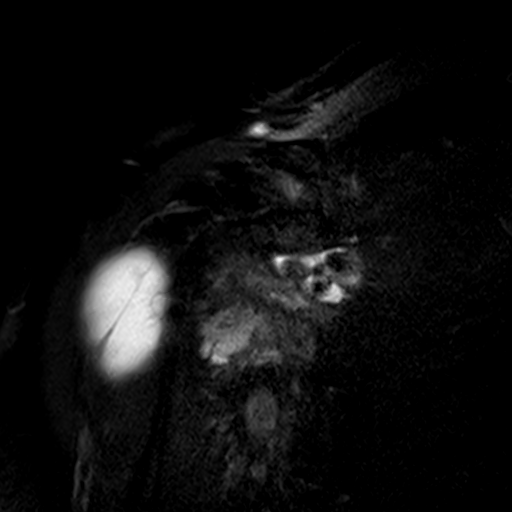
[im 6/17]
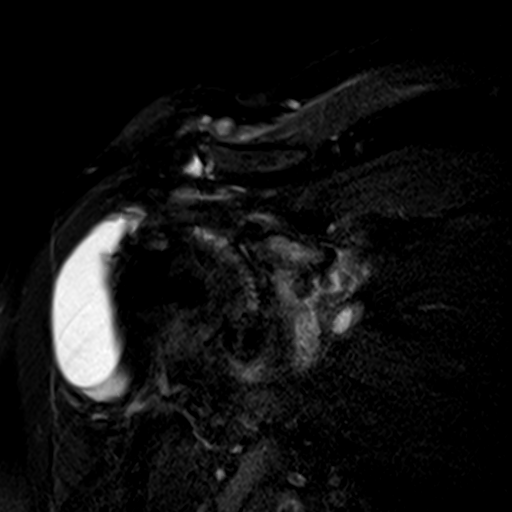
[im 9/17]
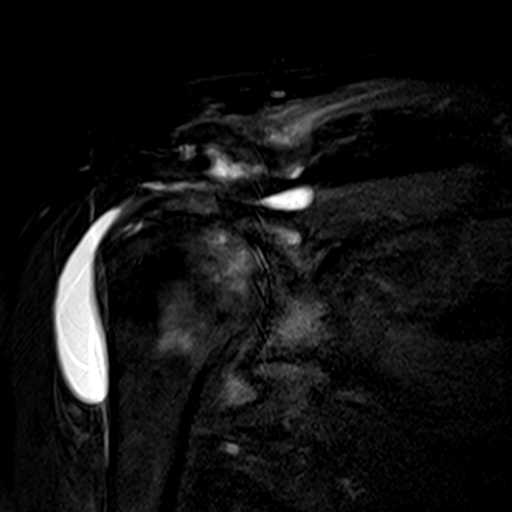
[im 11/17]
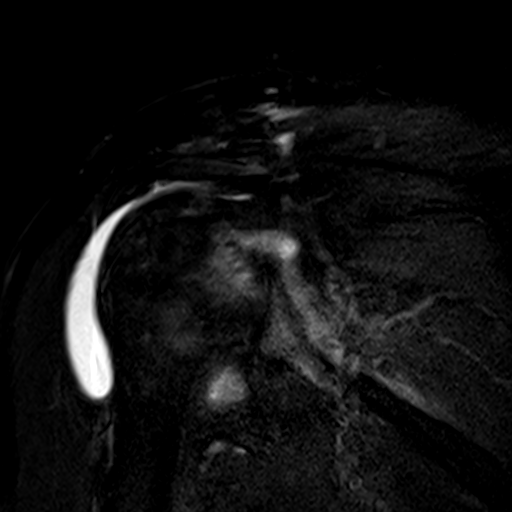
[im 14/17]
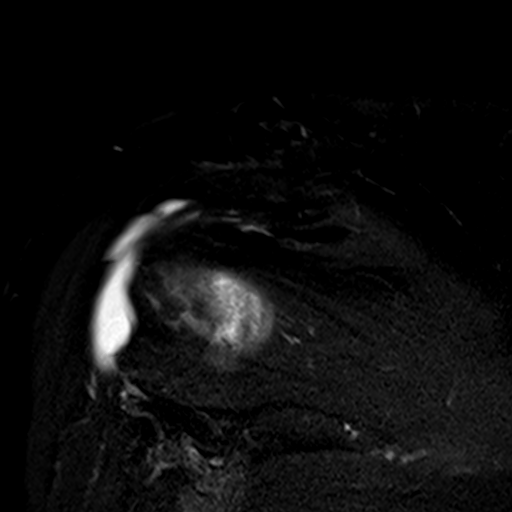
[im 17/17]
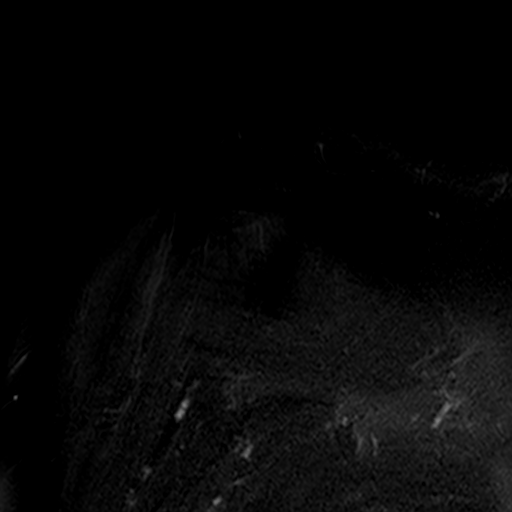

[Series 7: T1 · oblique · 4.0mm · 0.27mm/px · 3 of 20 slices shown]
[im 1/20]
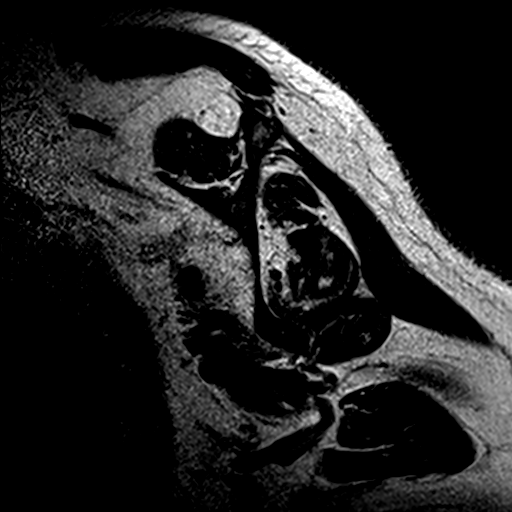
[im 3/20]
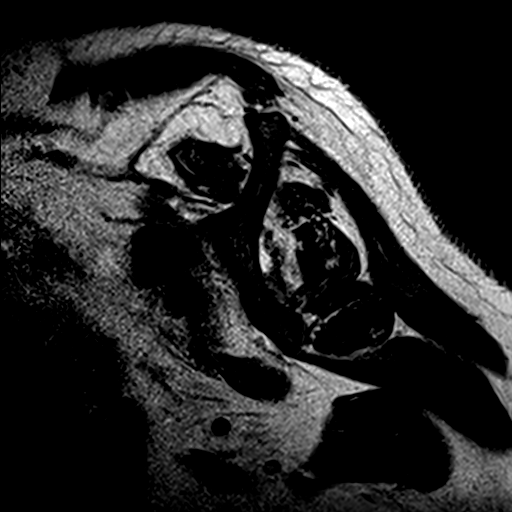
[im 5/20]
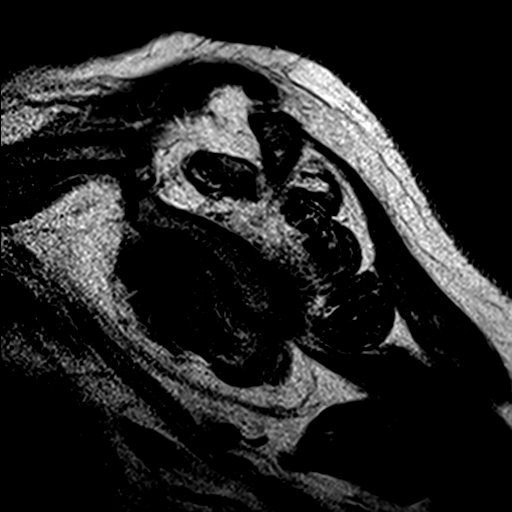

[Series 9: PD · oblique · 4.0mm · 0.55mm/px · 7 of 17 slices shown]
[im 1/17]
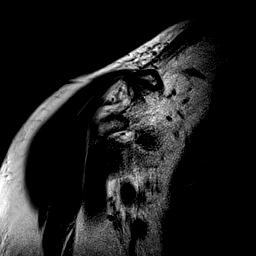
[im 3/17]
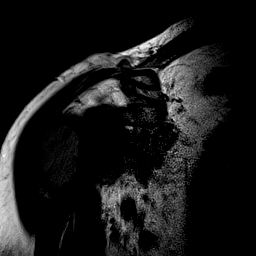
[im 6/17]
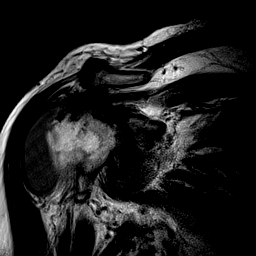
[im 9/17]
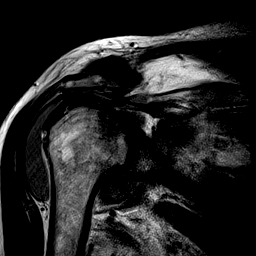
[im 11/17]
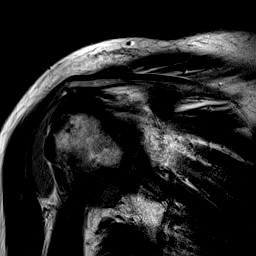
[im 14/17]
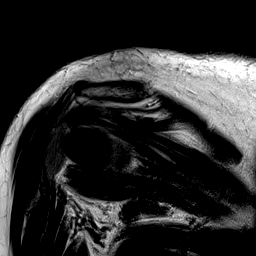
[im 17/17]
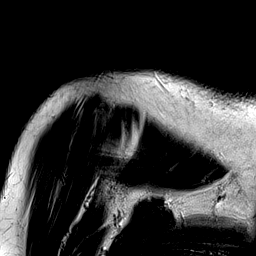

[34 of 40 positions shown; findings below may reference images not displayed]

FINDINGS: Despite efforts by the technologist and patient, motion artifact is
present on today's exam and could not be eliminated. This reduces
exam sensitivity and specificity.

Rotator cuff: Mild to moderate supraspinatus tendinopathy. Mild
subscapularis tendinopathy.

Muscles: Fluid collection tracks within along the myotendinous
junction of the subscapularis as on images 6 through 10 series 5.

Note is made of low-grade edema in the distal trapezius muscle

Biceps long head:  Not visualized, likely ruptured.

Acromioclavicular Joint: Moderate spurring and fluid within the
joint. Type II acromion. Distended subacromial subdeltoid bursa with
fluid. Subcoracoid bursitis noted. Bilobed cystic lesion above the
clavicle 1.8 by 1.2 by 0.6 cm, possibly a ganglion cyst.

Glenohumeral Joint: Severe degenerative glenohumeral arthropathy
with full-thickness loss of articular cartilage in confluent
subcortical foci of edema along the humeral head and glenoid.
Subcortical marrow edema noted. Small glenohumeral joint effusion
distending the axillary pouch. Is collection of osteochondral
fragments of various sizes in the subscapular recess, compatible
with secondary synovial osteochondromatosis. Synovitis within the
joint as on image [DATE].

Labrum:  Indeterminate due to motion

Bones: No significant extra-articular osseous abnormalities
identified.

Other: No supplemental non-categorized findings.
IMPRESSION: 1. Severe degenerative glenohumeral arthropathy with subcortical
marrow edema and confluent subcortical foci of T2 signal along the
humeral head and glenoid. Numerous osteochondral fragments of
various sizes in the subscapular recess compatible secondary
synovial osteochondromatosis. Considerable synovitis in the
glenohumeral joint.
2. Moderate degenerative AC joint arthropathy. Ancillary findings
include a small suspected synovial cyst above the distal clavicle,
and low-level edema in the distal trapezius muscle along the distal
clavicle.
3. Mild to moderate supraspinatus tendinopathy with mild
subscapularis tendinopathy, and a fluid collection/cyst tracking
along the myotendinous junction of the subscapularis muscle.
4. Nonvisualized long head biceps, likely ruptured.
5. Indeterminate labrum due to motion artifact.
6. The subacromial subdeltoid bursa is distended with fluid and
there is a small amount of fluid in the subcoracoid bursa.

## 2017-07-22 ENCOUNTER — Ambulatory Visit (INDEPENDENT_AMBULATORY_CARE_PROVIDER_SITE_OTHER): Payer: Medicare Other | Admitting: Orthopedic Surgery

## 2017-07-22 ENCOUNTER — Encounter (INDEPENDENT_AMBULATORY_CARE_PROVIDER_SITE_OTHER): Payer: Self-pay | Admitting: Orthopedic Surgery

## 2017-07-22 DIAGNOSIS — M25561 Pain in right knee: Secondary | ICD-10-CM | POA: Diagnosis not present

## 2017-07-22 DIAGNOSIS — G8929 Other chronic pain: Secondary | ICD-10-CM

## 2017-07-22 DIAGNOSIS — M19011 Primary osteoarthritis, right shoulder: Secondary | ICD-10-CM | POA: Diagnosis not present

## 2017-07-22 DIAGNOSIS — M1711 Unilateral primary osteoarthritis, right knee: Secondary | ICD-10-CM | POA: Diagnosis not present

## 2017-07-22 MED ORDER — OXYCODONE HCL 5 MG PO TABS: 5.0000 mg | ORAL_TABLET | Freq: Every day | ORAL | 0 refills | Status: DC | PRN

## 2017-07-25 DIAGNOSIS — M1711 Unilateral primary osteoarthritis, right knee: Secondary | ICD-10-CM | POA: Diagnosis not present

## 2017-07-25 DIAGNOSIS — M25561 Pain in right knee: Secondary | ICD-10-CM | POA: Diagnosis not present

## 2017-07-25 DIAGNOSIS — G8929 Other chronic pain: Secondary | ICD-10-CM

## 2017-07-25 DIAGNOSIS — M19011 Primary osteoarthritis, right shoulder: Secondary | ICD-10-CM | POA: Diagnosis not present

## 2017-07-25 MED ORDER — METHYLPREDNISOLONE ACETATE 40 MG/ML IJ SUSP
40.0000 mg | INTRAMUSCULAR | Status: AC | PRN
  Administered 2017-07-25: 40 mg via INTRA_ARTICULAR

## 2017-07-25 MED ORDER — LIDOCAINE HCL 1 % IJ SOLN
5.0000 mL | INTRAMUSCULAR | Status: AC | PRN
  Administered 2017-07-25: 5 mL

## 2017-07-25 MED ORDER — BUPIVACAINE HCL 0.5 % IJ SOLN
9.0000 mL | INTRAMUSCULAR | Status: AC | PRN
  Administered 2017-07-25: 9 mL via INTRA_ARTICULAR
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
## 2017-07-26 ENCOUNTER — Ambulatory Visit: Payer: Medicare Other | Admitting: Hematology and Oncology

## 2017-07-31 NOTE — Assessment & Plan Note (Signed)
Right breast invasive lobular cancer, 2.7 cm, ER/PR 100% positive, HER-2 negative ratio 1.12, Ki-67 31% sentinel node biopsy not performed status post right simple mastectomy 05/16/2012, grade 2-3, with DCIS grade 2, T2 NX stage II a pathologic staging, started Arimidex 06/10/2012  Arimidex toxicities: Tolerating it very well without any major problems or concerns. Denies any hot flashes . Patient is not experiencing right shoulder pain. I did not certain that it is Arimidex related. I instructed her to stop Arimidex for a month. If her symptoms improve then she should stop Arimidex indefinitely. If her symptoms do not improve then she can resume taking it.  Bone density 05/12/2014: T score -0.9 normal continue with calcium and vitamin D  Breast Cancer Surveillance: 1. Breast exam 08/01/2017: No palpable lumps or nodules that are of concern 2. Mammogram left breast 05/20/2017 No abnormalities suspicious for malignancy. Breast Density Category B. I recommended that she get 3-D mammograms for surveillance. Discussed the differences between different breast density categories.   Return to clinic in 1 year for follow-up 

## 2017-08-01 ENCOUNTER — Telehealth: Payer: Self-pay | Admitting: Hematology and Oncology

## 2017-08-01 ENCOUNTER — Encounter: Payer: Self-pay | Admitting: Hematology and Oncology

## 2017-08-01 ENCOUNTER — Ambulatory Visit (HOSPITAL_BASED_OUTPATIENT_CLINIC_OR_DEPARTMENT_OTHER): Payer: Medicare Other | Admitting: Hematology and Oncology

## 2017-08-01 DIAGNOSIS — Z853 Personal history of malignant neoplasm of breast: Secondary | ICD-10-CM | POA: Diagnosis not present

## 2017-08-01 DIAGNOSIS — Z170 Estrogen receptor positive status [ER+]: Principal | ICD-10-CM

## 2017-08-01 DIAGNOSIS — C50511 Malignant neoplasm of lower-outer quadrant of right female breast: Secondary | ICD-10-CM

## 2017-08-01 NOTE — Telephone Encounter (Signed)
No los 9/20 

## 2017-08-01 NOTE — Progress Notes (Signed)
Patient Care Team: Sheral Apley, MD as PCP - General (Family Medicine)  DIAGNOSIS:  Encounter Diagnosis  Name Primary?  . Malignant neoplasm of lower-outer quadrant of right breast of female, estrogen receptor positive ([place])     SUMMARY OF ONCOLOGIC HISTORY:   Breast cancer of lower-outer quadrant of right female breast ([place])   04/30/2012 Initial Diagnosis    Breast cancer, right  invasive lobular carcinoma. 2.7 cm ER positive PR positive HER-2/neu negative invasive lobular carcinoma. Sentinel node biopsy was not performed.      06/10/2012 - 08/2016 Anti-estrogen oral therapy    Arimidex 1 mg daily starting 06/10/2012       CHIEF COMPLIANT: Surveillance of breast cancer  INTERVAL HISTORY: Jean [name] is a 83 year old with above-mentioned history of right breast cancer treated with mastectomy and is currently on surveillance. She stop Arimidex therapy last year because she developed joint pain especially in the right shoulder. It also the joint pain is related to pseudogout. She gets injections as well as drainage of the fluid from the shoulder. She denies any lumps or nodules in the breast. Mammograms done in August 2018 were normal.  REVIEW OF SYSTEMS:   Constitutional: Denies fevers, chills or abnormal weight loss Eyes: Denies blurriness of vision Ears, nose, mouth, throat, and face: Denies mucositis or sore throat Respiratory: Denies cough, dyspnea or wheezes Cardiovascular: Denies palpitation, chest discomfort Gastrointestinal:  Denies nausea, heartburn or change in bowel habits Skin: Denies abnormal skin rashes Lymphatics: Denies new lymphadenopathy or easy bruising Neurological:Denies numbness, tingling or new weaknesses Behavioral/Psych: Mood is stable, no new changes  Extremities: No lower extremity edema Breast:  denies any pain or lumps or nodules in either breasts All other systems were reviewed with the patient and are negative.  I have reviewed the past  medical history, past surgical history, social history and family history with the patient and they are unchanged from previous note.  ALLERGIES:  is allergic to cephalexin; hydromorphone hcl; penicillins; avelox [moxifloxacin hcl in nacl]; pentazocine; and sulfa antibiotics.  MEDICATIONS:  Current Outpatient Prescriptions  Medication Sig Dispense Refill  . AMLODIPINE BESYLATE PO Take 5 mg by mouth daily.    . bumetanide (BUMEX) 1 MG tablet Take 1 mg by mouth daily.    . cyanocobalamin (,VITAMIN B-12,) 1000 MCG/ML injection Inject into the muscle.    . ferrous sulfate 325 (65 FE) MG tablet Take 325 mg by mouth daily with breakfast.    . hydrALAZINE (APRESOLINE) 25 MG tablet Take 25 mg by mouth 3 (three) times daily.     . methylPREDNISolone (MEDROL) 4 MG tablet TAKE AS DIRECTED 21 tablet 0  . mirtazapine (REMERON) 15 MG tablet Take 15 mg by mouth at bedtime.    . nadolol (CORGARD) 40 MG tablet Take 20 mg by mouth daily.     Marland Kitchen oxyCODONE (OXY IR/ROXICODONE) 5 MG immediate release tablet Take 1 tablet (5 mg total) by mouth daily as needed for severe pain. 30 tablet 0  . oxycodone (OXY-IR) 5 MG capsule Take 1 capsule (5 mg total) by mouth 2 (two) times daily. 45 capsule 0  . PARoxetine (PAXIL) 20 MG tablet Take 20 mg by mouth daily.    . potassium chloride (K-DUR) 10 MEQ tablet Take 20 mEq by mouth daily.    . potassium chloride SA (K-DUR,KLOR-CON) 20 MEQ tablet TAKE 1 TABLET EVERY DAY    . pravastatin (PRAVACHOL) 40 MG tablet Take 40 mg by mouth daily.    . traMADol (  ULTRAM) 50 MG tablet TAKE 1 TABLET BY MOUTH EVERY 12 HOURS AS NEEDED FOR PAIN 40 tablet 0   No current facility-administered medications for this visit.     PHYSICAL EXAMINATION: ECOG PERFORMANCE STATUS: 1 - Symptomatic but completely ambulatory  Vitals:   08/01/17 0827  BP: (!) 157/66  Pulse: 62  Resp: 20  Temp: 98.5 F (36.9 C)  SpO2: 98%   Filed Weights   08/01/17 0827  Weight: 163 lb 6.4 oz (74.1 kg)     GENERAL:alert, no distress and comfortable SKIN: skin color, texture, turgor are normal, no rashes or significant lesions EYES: normal, Conjunctiva are pink and non-injected, sclera clear OROPHARYNX:no exudate, no erythema and lips, buccal mucosa, and tongue normal  NECK: supple, thyroid normal size, non-tender, without nodularity LYMPH:  no palpable lymphadenopathy in the cervical, axillary or inguinal LUNGS: clear to auscultation and percussion with normal breathing effort HEART: regular rate & rhythm and no murmurs and no lower extremity edema ABDOMEN:abdomen soft, non-tender and normal bowel sounds MUSCULOSKELETAL:no cyanosis of digits and no clubbing  NEURO: alert & oriented x 3 with fluent speech, no focal motor/sensory deficits EXTREMITIES: No lower extremity edema BREAST: No palpable masses or nodules in right chest wall or axilla. No palpable lumps or nodules in the left breast. (exam performed in the presence of a chaperone)  LABORATORY DATA:  I have reviewed the data as listed   Chemistry      Component Value Date/Time   NA 142 07/26/2015 0846   K 3.6 07/26/2015 0846   CL 103 02/17/2013 0749   CL 101 01/09/2013 0943   CO2 28 07/26/2015 0846   BUN 19.5 07/26/2015 0846   CREATININE 1.4 (H) 07/26/2015 0846      Component Value Date/Time   CALCIUM 9.5 07/26/2015 0846   ALKPHOS 90 07/26/2015 0846   AST 13 07/26/2015 0846   ALT 8 07/26/2015 0846   BILITOT 0.57 07/26/2015 0846       Lab Results  Component Value Date   WBC 9.0 07/26/2015   HGB 12.3 07/26/2015   HCT 36.4 07/26/2015   MCV 87.9 07/26/2015   PLT 266 07/26/2015   NEUTROABS 6.7 (H) 07/26/2015    ASSESSMENT & PLAN:  Breast cancer of lower-outer quadrant of right female breast ([place]) Right breast invasive lobular cancer, 2.7 cm, ER/PR 100% positive, HER-2 negative ratio 1.12, Ki-67 31% sentinel node biopsy not performed status post right simple mastectomy 05/16/2012, grade 2-3, with DCIS grade 2, T2  NX stage II a pathologic staging, started Arimidex 06/10/2012 Stopped October 2017  Arimidex toxicities: Tolerated it very well without any major problems or concerns. Denies any hot flashes . Severe right shoulder pain: Sees orthopedics and has pain medications. Occasionally she gets injections and fluid removal. She was asking for stronger pain medication, I encouraged her to discuss this with orthopedics  Bone density 05/12/2014: T score -0.9 normal continue with calcium and vitamin D  Breast Cancer Surveillance: 1. Breast exam 08/01/2017: No palpable lumps or nodules that are of concern 2. Mammogram left breast 05/20/2017 No abnormalities suspicious for malignancy. Breast Density Category B. I recommended that she get 3-D mammograms for surveillance. Discussed the differences between different breast density categories.   Patient will follow up with her primary care physician for her breast cancer surveillance. We can see the patient at any time, if there are any further questions or concerns regarding her breasts in the future.  I spent 15 minutes talking to the patient of  which more than half was spent in counseling and coordination of care.  No orders of the defined types were placed in this encounter.  The patient has a good understanding of the overall plan. she agrees with it. she will call with any problems that may develop before the next visit here.   Rulon Eisenmenger, MD 08/01/17

## 2017-09-26 ENCOUNTER — Encounter (INDEPENDENT_AMBULATORY_CARE_PROVIDER_SITE_OTHER): Payer: Self-pay | Admitting: Orthopedic Surgery

## 2017-09-26 ENCOUNTER — Ambulatory Visit (INDEPENDENT_AMBULATORY_CARE_PROVIDER_SITE_OTHER): Payer: Medicare Other | Admitting: Orthopedic Surgery

## 2017-09-26 DIAGNOSIS — M1711 Unilateral primary osteoarthritis, right knee: Secondary | ICD-10-CM | POA: Diagnosis not present

## 2017-09-26 DIAGNOSIS — M19011 Primary osteoarthritis, right shoulder: Secondary | ICD-10-CM | POA: Diagnosis not present

## 2017-09-26 MED ORDER — LIDOCAINE HCL 1 % IJ SOLN
5.0000 mL | INTRAMUSCULAR | Status: AC | PRN
  Administered 2017-09-26: 5 mL

## 2017-09-26 MED ORDER — OXYCODONE HCL 5 MG PO TABS: 5.0000 mg | ORAL_TABLET | Freq: Every day | ORAL | 0 refills | Status: AC | PRN
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
## 2017-11-28 ENCOUNTER — Encounter (INDEPENDENT_AMBULATORY_CARE_PROVIDER_SITE_OTHER): Payer: Self-pay | Admitting: Orthopedic Surgery

## 2017-11-28 ENCOUNTER — Ambulatory Visit (INDEPENDENT_AMBULATORY_CARE_PROVIDER_SITE_OTHER): Payer: Medicare Other | Admitting: Orthopedic Surgery

## 2017-11-28 DIAGNOSIS — M19011 Primary osteoarthritis, right shoulder: Secondary | ICD-10-CM

## 2017-11-28 DIAGNOSIS — M1711 Unilateral primary osteoarthritis, right knee: Secondary | ICD-10-CM

## 2017-11-28 MED ORDER — OXYCODONE HCL 5 MG PO CAPS: ORAL_CAPSULE | ORAL | 0 refills | Status: AC

## 2017-11-30 ENCOUNTER — Encounter (INDEPENDENT_AMBULATORY_CARE_PROVIDER_SITE_OTHER): Payer: Self-pay | Admitting: Orthopedic Surgery

## 2017-11-30 DIAGNOSIS — M1711 Unilateral primary osteoarthritis, right knee: Secondary | ICD-10-CM

## 2017-11-30 DIAGNOSIS — M19011 Primary osteoarthritis, right shoulder: Secondary | ICD-10-CM

## 2017-11-30 MED ORDER — LIDOCAINE HCL 1 % IJ SOLN
5.0000 mL | INTRAMUSCULAR | Status: AC | PRN
  Administered 2017-11-30: 5 mL

## 2017-11-30 MED ORDER — METHYLPREDNISOLONE ACETATE 40 MG/ML IJ SUSP
40.0000 mg | INTRAMUSCULAR | Status: AC | PRN
  Administered 2017-11-30: 40 mg via INTRA_ARTICULAR

## 2017-11-30 MED ORDER — BUPIVACAINE HCL 0.5 % IJ SOLN
9.0000 mL | INTRAMUSCULAR | Status: AC | PRN
  Administered 2017-11-30: 9 mL via INTRA_ARTICULAR

## 2017-11-30 MED ORDER — BUPIVACAINE HCL 0.25 % IJ SOLN
4.0000 mL | INTRAMUSCULAR | Status: AC | PRN
  Administered 2017-11-30: 4 mL via INTRA_ARTICULAR
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
## 2018-02-10 ENCOUNTER — Ambulatory Visit (INDEPENDENT_AMBULATORY_CARE_PROVIDER_SITE_OTHER): Payer: Medicare Other | Admitting: Orthopedic Surgery

## 2018-02-10 ENCOUNTER — Encounter (INDEPENDENT_AMBULATORY_CARE_PROVIDER_SITE_OTHER): Payer: Self-pay | Admitting: Orthopedic Surgery

## 2018-02-10 DIAGNOSIS — M1711 Unilateral primary osteoarthritis, right knee: Secondary | ICD-10-CM | POA: Diagnosis not present

## 2018-02-10 DIAGNOSIS — M19011 Primary osteoarthritis, right shoulder: Secondary | ICD-10-CM | POA: Diagnosis not present

## 2018-02-10 MED ORDER — BUPIVACAINE HCL 0.25 % IJ SOLN
4.0000 mL | INTRAMUSCULAR | Status: AC | PRN
  Administered 2018-02-10: 4 mL via INTRA_ARTICULAR

## 2018-02-10 MED ORDER — LIDOCAINE HCL 1 % IJ SOLN
5.0000 mL | INTRAMUSCULAR | Status: AC | PRN
  Administered 2018-02-10: 5 mL

## 2018-02-10 MED ORDER — METHYLPREDNISOLONE ACETATE 40 MG/ML IJ SUSP
40.0000 mg | INTRAMUSCULAR | Status: AC | PRN
  Administered 2018-02-10: 40 mg via INTRA_ARTICULAR

## 2018-02-10 MED ORDER — BUPIVACAINE HCL 0.5 % IJ SOLN
9.0000 mL | INTRAMUSCULAR | Status: AC | PRN
  Administered 2018-02-10: 9 mL via INTRA_ARTICULAR
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
## 2018-02-13 ENCOUNTER — Telehealth (INDEPENDENT_AMBULATORY_CARE_PROVIDER_SITE_OTHER): Payer: Self-pay | Admitting: Orthopedic Surgery

## 2018-02-13 NOTE — Telephone Encounter (Signed)
Leigh from [employer] called asking for a prior authorization on the hydrochloride. CB # [phone]

## 2018-02-13 NOTE — Telephone Encounter (Signed)
I have started PA on [email or web address] yesterday and was advised would be 72hrs before we heard anything back.

## 2018-02-17 ENCOUNTER — Telehealth (INDEPENDENT_AMBULATORY_CARE_PROVIDER_SITE_OTHER): Payer: Self-pay

## 2018-02-17 NOTE — Telephone Encounter (Signed)
Tried getting prior auth for patients amitriptyline, but they denied coverage. Do you want to change to anything different?

## 2018-02-18 MED ORDER — GABAPENTIN 100 MG PO CAPS: ORAL_CAPSULE | ORAL | 0 refills | Status: DC

## 2018-02-18 NOTE — Telephone Encounter (Signed)
Try neurontin 200 po tid # 60 pls clal thx po

## 2018-02-18 NOTE — Telephone Encounter (Signed)
FYI For the neurontin I had to send in #180 tablets b/c it does not come in 200mg  tablet. Only 100mg  or 300mg .

## 2018-02-18 NOTE — Telephone Encounter (Signed)
Patient stated missed your call. Wants you to call her back.

## 2018-02-18 NOTE — Telephone Encounter (Signed)
ok 

## 2018-02-18 NOTE — Addendum Note (Signed)
Addended byLaurann Montana on: 02/18/2018 02:10 PM   Modules accepted: Orders

## 2018-02-18 NOTE — Telephone Encounter (Signed)
I tried calling patient to discuss. No answer. LMVM for her to call me back to discuss. Also need to verify pharmacy she wants rx sent to.

## 2018-02-18 NOTE — Telephone Encounter (Signed)
Uses [facility] in [place]

## 2018-03-18 ENCOUNTER — Other Ambulatory Visit (INDEPENDENT_AMBULATORY_CARE_PROVIDER_SITE_OTHER): Payer: Self-pay | Admitting: Orthopedic Surgery

## 2018-03-18 NOTE — Telephone Encounter (Signed)
Ok to rf? 

## 2018-03-18 NOTE — Telephone Encounter (Signed)
y

## 2018-03-31 ENCOUNTER — Ambulatory Visit (INDEPENDENT_AMBULATORY_CARE_PROVIDER_SITE_OTHER): Payer: Medicare Other | Admitting: Orthopedic Surgery

## 2018-03-31 ENCOUNTER — Telehealth (INDEPENDENT_AMBULATORY_CARE_PROVIDER_SITE_OTHER): Payer: Self-pay | Admitting: Orthopedic Surgery

## 2018-03-31 ENCOUNTER — Encounter (INDEPENDENT_AMBULATORY_CARE_PROVIDER_SITE_OTHER): Payer: Self-pay | Admitting: Orthopedic Surgery

## 2018-03-31 DIAGNOSIS — M25461 Effusion, right knee: Secondary | ICD-10-CM

## 2018-03-31 NOTE — Telephone Encounter (Signed)
Pt right leg is filled with bruises  I did send call to triage no availability  on Dr.Dean sched

## 2018-04-05 ENCOUNTER — Encounter (INDEPENDENT_AMBULATORY_CARE_PROVIDER_SITE_OTHER): Payer: Self-pay | Admitting: Orthopedic Surgery

## 2018-04-05 DIAGNOSIS — M25461 Effusion, right knee: Secondary | ICD-10-CM | POA: Diagnosis not present

## 2018-04-05 MED ORDER — METHYLPREDNISOLONE ACETATE 40 MG/ML IJ SUSP
40.0000 mg | INTRAMUSCULAR | Status: AC | PRN
  Administered 2018-04-05: 40 mg via INTRA_ARTICULAR

## 2018-04-05 MED ORDER — LIDOCAINE HCL 1 % IJ SOLN
5.0000 mL | INTRAMUSCULAR | Status: AC | PRN
  Administered 2018-04-05: 5 mL

## 2018-04-05 MED ORDER — BUPIVACAINE HCL 0.25 % IJ SOLN
4.0000 mL | INTRAMUSCULAR | Status: AC | PRN
  Administered 2018-04-05: 4 mL via INTRA_ARTICULAR
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
## 2018-04-15 ENCOUNTER — Other Ambulatory Visit: Payer: Self-pay | Admitting: Family Medicine

## 2018-04-15 DIAGNOSIS — Z1231 Encounter for screening mammogram for malignant neoplasm of breast: Secondary | ICD-10-CM

## 2018-04-17 ENCOUNTER — Other Ambulatory Visit (INDEPENDENT_AMBULATORY_CARE_PROVIDER_SITE_OTHER): Payer: Self-pay | Admitting: Orthopedic Surgery

## 2018-04-17 NOTE — Telephone Encounter (Signed)
Ok to rf? 

## 2018-04-17 NOTE — Telephone Encounter (Signed)
y

## 2018-05-14 ENCOUNTER — Encounter (INDEPENDENT_AMBULATORY_CARE_PROVIDER_SITE_OTHER): Payer: Self-pay | Admitting: Orthopedic Surgery

## 2018-05-14 ENCOUNTER — Ambulatory Visit (INDEPENDENT_AMBULATORY_CARE_PROVIDER_SITE_OTHER): Payer: Medicare Other | Admitting: Orthopedic Surgery

## 2018-05-14 DIAGNOSIS — M25461 Effusion, right knee: Secondary | ICD-10-CM | POA: Diagnosis not present

## 2018-05-14 MED ORDER — GABAPENTIN 300 MG PO CAPS: 300.0000 mg | ORAL_CAPSULE | Freq: Three times a day (TID) | ORAL | 0 refills | Status: DC
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
## 2018-05-21 ENCOUNTER — Ambulatory Visit
Admission: RE | Admit: 2018-05-21 | Discharge: 2018-05-21 | Disposition: A | Discharge status: Home / Self Care | Payer: Medicare Other | Source: Ambulatory Visit | Attending: Family Medicine | Admitting: Family Medicine

## 2018-05-21 DIAGNOSIS — Z1231 Encounter for screening mammogram for malignant neoplasm of breast: Secondary | ICD-10-CM

## 2018-06-17 ENCOUNTER — Other Ambulatory Visit (INDEPENDENT_AMBULATORY_CARE_PROVIDER_SITE_OTHER): Payer: Self-pay

## 2018-06-17 MED ORDER — GABAPENTIN 300 MG PO CAPS: 300.0000 mg | ORAL_CAPSULE | Freq: Three times a day (TID) | ORAL | 0 refills | Status: DC

## 2018-06-17 NOTE — Telephone Encounter (Signed)
Patient pharmacy request rxrf on neurontin. I submitted refill.

## 2018-07-16 ENCOUNTER — Telehealth (INDEPENDENT_AMBULATORY_CARE_PROVIDER_SITE_OTHER): Payer: Self-pay

## 2018-07-16 MED ORDER — GABAPENTIN 300 MG PO CAPS: 300.0000 mg | ORAL_CAPSULE | Freq: Three times a day (TID) | ORAL | 0 refills | Status: DC

## 2018-07-16 NOTE — Telephone Encounter (Signed)
Patient request refill on neurontin Sent to pharmacy.

## 2018-07-16 NOTE — Telephone Encounter (Signed)
y

## 2018-08-20 ENCOUNTER — Encounter (INDEPENDENT_AMBULATORY_CARE_PROVIDER_SITE_OTHER): Payer: Self-pay | Admitting: Orthopedic Surgery

## 2018-08-20 ENCOUNTER — Ambulatory Visit (INDEPENDENT_AMBULATORY_CARE_PROVIDER_SITE_OTHER): Payer: Medicare Other | Admitting: Orthopedic Surgery

## 2018-08-20 DIAGNOSIS — G8929 Other chronic pain: Secondary | ICD-10-CM

## 2018-08-20 DIAGNOSIS — M25511 Pain in right shoulder: Secondary | ICD-10-CM | POA: Diagnosis not present

## 2018-08-20 DIAGNOSIS — M1711 Unilateral primary osteoarthritis, right knee: Secondary | ICD-10-CM | POA: Diagnosis not present

## 2018-08-20 MED ORDER — LIDOCAINE HCL 1 % IJ SOLN
5.0000 mL | INTRAMUSCULAR | Status: AC | PRN
  Administered 2018-08-20: 5 mL

## 2018-08-20 MED ORDER — BUPIVACAINE HCL 0.25 % IJ SOLN
4.0000 mL | INTRAMUSCULAR | Status: AC | PRN
  Administered 2018-08-20: 4 mL via INTRA_ARTICULAR

## 2018-08-20 MED ORDER — BUPIVACAINE HCL 0.5 % IJ SOLN
9.0000 mL | INTRAMUSCULAR | Status: AC | PRN
  Administered 2018-08-20: 9 mL via INTRA_ARTICULAR
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
## 2018-09-01 ENCOUNTER — Telehealth (INDEPENDENT_AMBULATORY_CARE_PROVIDER_SITE_OTHER): Payer: Self-pay

## 2018-09-01 MED ORDER — GABAPENTIN 300 MG PO CAPS: 300.0000 mg | ORAL_CAPSULE | Freq: Three times a day (TID) | ORAL | 0 refills | Status: AC

## 2018-09-01 NOTE — Telephone Encounter (Signed)
y

## 2018-09-01 NOTE — Addendum Note (Signed)
Addended by: Laurann Montana on: 09/01/2018 11:11 AM   Modules accepted: Orders

## 2018-09-01 NOTE — Telephone Encounter (Signed)
Patient is wanting a refill on gabapentin. Please advise. Thanks.

## 2018-09-01 NOTE — Telephone Encounter (Signed)
submitted

## 2018-09-16 ENCOUNTER — Telehealth: Payer: Self-pay

## 2018-09-16 NOTE — Telephone Encounter (Signed)
Received call from Gerome Sam from [place] office (Pt PCP). 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 Dr.[name] had CT CAP ordered prior to pt getting an appt with Dr.Gudena. Pt was last seen on 07/2017 for an appt. Her primary care is concern for possible recurrence in disease. Made appt for next week. Confirmed time/date with pcp office, and they will notify the pt. Provided their office with fax number to send results for CT. Pt medical record can be accessed through care everywhere tab.

## 2018-09-25 ENCOUNTER — Inpatient Hospital Stay: Payer: Medicare Other | Attending: Hematology and Oncology | Admitting: Hematology and Oncology

## 2018-09-25 NOTE — Assessment & Plan Note (Deleted)
Right breast invasive lobular cancer, 2.7 cm, ER/PR 100% positive, HER-2 negative ratio 1.12, Ki-67 31% sentinel node biopsy not performed status post right simple mastectomy 05/16/2012, grade 2-3, with DCIS grade 2, T2 NX stage II a pathologic staging, started Arimidex 06/10/2012 Stopped October 2017  Bone density 05/12/2014: T score -0.9 normal continue with calcium and vitamin D  Breast Cancer Surveillance: 1. Breast exam 09/25/2018: No palpable lumps or nodules that are of concern 2. Mammogram left breast 05/21/2018 no abnormalities suspicious for malignancy. Breast Density Category B  Patient will follow up with her primary care physician for her breast cancer surveillance.  We can see her on an as-needed basis

## 2018-10-12 DEATH — deceased
